# Patient Record
Sex: Female | Born: 1990 | Hispanic: Yes | Marital: Single | Smoking: Never smoker
Health system: Southern US, Community
[De-identification: ages and names within clinical notes are randomized; demographics above are authoritative.]

## PROBLEM LIST (undated history)

## (undated) ENCOUNTER — Inpatient Hospital Stay: Payer: Self-pay

## (undated) VITALS — BP 89/57 | HR 80 | Resp 18

## (undated) DIAGNOSIS — Z789 Other specified health status: Secondary | ICD-10-CM

## (undated) HISTORY — PX: NO PAST SURGERIES: SHX2092

---
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
## 2010-10-28 ENCOUNTER — Inpatient Hospital Stay: Payer: Self-pay

## 2011-07-28 ENCOUNTER — Emergency Department: Payer: Self-pay | Admitting: Emergency Medicine

## 2011-07-28 LAB — CBC
HCT: 36.4 % (ref 35.0–47.0)
MCH: 27.6 pg (ref 26.0–34.0)
MCV: 82 fL (ref 80–100)
RBC: 4.45 10*6/uL (ref 3.80–5.20)
RDW: 13.3 % (ref 11.5–14.5)
WBC: 7.4 10*3/uL (ref 3.6–11.0)

## 2011-07-28 LAB — COMPREHENSIVE METABOLIC PANEL
Albumin: 4.3 g/dL (ref 3.4–5.0)
Alkaline Phosphatase: 109 U/L (ref 50–136)
Bilirubin,Total: 0.6 mg/dL (ref 0.2–1.0)
Calcium, Total: 8.8 mg/dL (ref 8.5–10.1)
Chloride: 108 mmol/L — ABNORMAL HIGH (ref 98–107)
Co2: 21 mmol/L (ref 21–32)
EGFR (Non-African Amer.): >60
Potassium: 3.8 mmol/L (ref 3.5–5.1)
SGPT (ALT): 16 U/L
Sodium: 142 mmol/L (ref 136–145)

## 2012-03-25 ENCOUNTER — Emergency Department: Payer: Self-pay | Admitting: Emergency Medicine

## 2012-03-25 LAB — CBC
HCT: 35.3 % (ref 35.0–47.0)
MCV: 82 fL (ref 80–100)
Platelet: 159 10*3/uL (ref 150–440)
RBC: 4.3 10*6/uL (ref 3.80–5.20)
WBC: 6.6 10*3/uL (ref 3.6–11.0)

## 2012-10-30 ENCOUNTER — Ambulatory Visit: Payer: Self-pay | Admitting: Family Medicine

## 2014-08-16 ENCOUNTER — Emergency Department
Admit: 2014-08-16 | Disposition: A | Discharge status: Home / Self Care | Payer: Self-pay | Admitting: Emergency Medicine

## 2014-08-16 LAB — CBC
HCT: 36.3 % (ref 35.0–47.0)
HGB: 12.4 g/dL (ref 12.0–16.0)
MCH: 29.1 pg (ref 26.0–34.0)
MCHC: 34 g/dL (ref 32.0–36.0)
MCV: 85 fL (ref 80–100)
PLATELETS: 185 10*3/uL (ref 150–440)
RBC: 4.26 10*6/uL (ref 3.80–5.20)
RDW: 13 % (ref 11.5–14.5)
WBC: 7.1 10*3/uL (ref 3.6–11.0)

## 2014-08-16 LAB — BASIC METABOLIC PANEL
Anion Gap: 4 — ABNORMAL LOW (ref 7–16)
BUN: 18 mg/dL
CHLORIDE: 106 mmol/L
CO2: 22 mmol/L
Calcium, Total: 8.6 mg/dL — ABNORMAL LOW
Creatinine: 0.87 mg/dL
EGFR (African American): >60
EGFR (Non-African Amer.): >60
GLUCOSE: 91 mg/dL
Potassium: 5 mmol/L
Sodium: 132 mmol/L — ABNORMAL LOW

## 2014-08-16 LAB — URINALYSIS, COMPLETE
BILIRUBIN, UR: NEGATIVE
GLUCOSE, UR: NEGATIVE mg/dL (ref 0–75)
Ketone: NEGATIVE
Nitrite: NEGATIVE
Ph: 6 (ref 4.5–8.0)
Protein: 100
Specific Gravity: 1.025 (ref 1.003–1.030)
Squamous Epithelial: 3
WBC UR: 49 /HPF (ref 0–5)

## 2014-08-16 LAB — PREGNANCY, URINE: Pregnancy Test, Urine: NEGATIVE m[IU]/mL

## 2014-09-12 ENCOUNTER — Emergency Department
Admit: 2014-09-12 | Discharge: 2014-09-13 | Disposition: A | Discharge status: Home / Self Care | Payer: Medicaid Other | Source: Ambulatory Visit | Attending: Emergency Medicine | Admitting: Emergency Medicine

## 2014-09-12 DIAGNOSIS — Z3202 Encounter for pregnancy test, result negative: Secondary | ICD-10-CM | POA: Diagnosis not present

## 2014-09-12 DIAGNOSIS — R110 Nausea: Secondary | ICD-10-CM | POA: Diagnosis not present

## 2014-09-12 DIAGNOSIS — R6883 Chills (without fever): Secondary | ICD-10-CM | POA: Diagnosis not present

## 2014-09-12 DIAGNOSIS — N760 Acute vaginitis: Secondary | ICD-10-CM | POA: Insufficient documentation

## 2014-09-12 DIAGNOSIS — R42 Dizziness and giddiness: Secondary | ICD-10-CM | POA: Diagnosis not present

## 2014-09-12 DIAGNOSIS — Z975 Presence of (intrauterine) contraceptive device: Secondary | ICD-10-CM

## 2014-09-12 DIAGNOSIS — N83519 Torsion of ovary and ovarian pedicle, unspecified side: Secondary | ICD-10-CM

## 2014-09-12 DIAGNOSIS — R1032 Left lower quadrant pain: Secondary | ICD-10-CM

## 2014-09-12 DIAGNOSIS — R109 Unspecified abdominal pain: Secondary | ICD-10-CM | POA: Diagnosis present

## 2014-09-12 DIAGNOSIS — B9689 Other specified bacterial agents as the cause of diseases classified elsewhere: Secondary | ICD-10-CM

## 2014-09-12 LAB — COMPREHENSIVE METABOLIC PANEL
Albumin: 4.1 g/dL
Alkaline Phosphatase: 68 U/L
Anion Gap: 6 — ABNORMAL LOW (ref 7–16)
BUN: 9 mg/dL
Bilirubin,Total: 0.5 mg/dL
Calcium, Total: 8.7 mg/dL — ABNORMAL LOW
Chloride: 106 mmol/L
Co2: 24 mmol/L
Creatinine: 0.55 mg/dL
EGFR (African American): >60
EGFR (Non-African Amer.): >60
Glucose: 87 mg/dL
Potassium: 3.4 mmol/L — ABNORMAL LOW
SGOT(AST): 20 U/L
SGPT (ALT): 13 U/L — ABNORMAL LOW
Sodium: 136 mmol/L
Total Protein: 7.1 g/dL

## 2014-09-12 LAB — CBC WITH DIFFERENTIAL/PLATELET
BASOS PCT: 1.3 %
Basophil #: 0.1 10*3/uL (ref 0.0–0.1)
Eosinophil #: 0.1 10*3/uL (ref 0.0–0.7)
Eosinophil %: 1.2 %
HCT: 38.6 % (ref 35.0–47.0)
HGB: 12.4 g/dL (ref 12.0–16.0)
LYMPHS ABS: 1.9 10*3/uL (ref 1.0–3.6)
Lymphocyte %: 30.1 %
MCH: 27.6 pg (ref 26.0–34.0)
MCHC: 32.2 g/dL (ref 32.0–36.0)
MCV: 86 fL (ref 80–100)
MONOS PCT: 6.4 %
Monocyte #: 0.4 x10 3/mm (ref 0.2–0.9)
NEUTROS ABS: 3.9 10*3/uL (ref 1.4–6.5)
Neutrophil %: 61 %
Platelet: 187 10*3/uL (ref 150–440)
RBC: 4.51 10*6/uL (ref 3.80–5.20)
RDW: 13 % (ref 11.5–14.5)
WBC: 6.5 10*3/uL (ref 3.6–11.0)

## 2014-09-12 LAB — LIPASE, BLOOD: Lipase: 40 U/L

## 2014-09-13 ENCOUNTER — Encounter: Payer: Self-pay | Admitting: Emergency Medicine

## 2014-09-13 ENCOUNTER — Emergency Department: Payer: Medicaid Other

## 2014-09-13 LAB — CHLAMYDIA/NGC RT PCR (ARMC ONLY)
Chlamydia Tr: NOT DETECTED
N gonorrhoeae: NOT DETECTED

## 2014-09-13 LAB — URINALYSIS COMPLETE WITH MICROSCOPIC (ARMC ONLY)
BACTERIA UA: NONE SEEN
Bilirubin Urine: NEGATIVE
GLUCOSE, UA: NEGATIVE mg/dL
Hgb urine dipstick: NEGATIVE
Ketones, ur: NEGATIVE mg/dL
NITRITE: NEGATIVE
Protein, ur: NEGATIVE mg/dL
Specific Gravity, Urine: 1.006 (ref 1.005–1.030)
pH: 6 (ref 5.0–8.0)

## 2014-09-13 LAB — WET PREP, GENITAL
Trich, Wet Prep: NONE SEEN
Yeast Wet Prep HPF POC: NONE SEEN

## 2014-09-13 LAB — POCT PREGNANCY, URINE: PREG TEST UR: NEGATIVE

## 2014-09-13 MED ORDER — OXYCODONE-ACETAMINOPHEN 5-325 MG PO TABS
1.0000 | ORAL_TABLET | Freq: Once | ORAL | Status: AC
  Administered 2014-09-13: 1 via ORAL

## 2014-09-13 MED ORDER — TRAMADOL HCL 50 MG PO TABS: 50.0000 mg | ORAL_TABLET | Freq: Four times a day (QID) | ORAL | Status: AC | PRN

## 2014-09-13 MED ORDER — METRONIDAZOLE 500 MG PO TABS
ORAL_TABLET | ORAL | Status: AC
  Filled 2014-09-13: qty 1

## 2014-09-13 MED ORDER — METRONIDAZOLE 500 MG PO TABS: 500.0000 mg | ORAL_TABLET | Freq: Two times a day (BID) | ORAL | Status: AC

## 2014-09-13 MED ORDER — METRONIDAZOLE 500 MG PO TABS
500.0000 mg | ORAL_TABLET | Freq: Once | ORAL | Status: AC
  Administered 2014-09-13: 500 mg via ORAL

## 2014-09-13 NOTE — ED Notes (Signed)
Pt ambulatory with steady gait upon discharge.  

## 2014-09-13 NOTE — ED Notes (Signed)
MD at bedside. 

## 2014-09-13 NOTE — ED Provider Notes (Signed)
Patient seen prior to epic migration. Please refer to paper T-sheet for history and physical exam  Rebecka ApleyAllison P Webster, MD 09/13/14 [phone]

## 2014-09-13 NOTE — ED Notes (Signed)
Patient triaged and assessed in sunrise and [facility]

## 2014-09-13 NOTE — Discharge Instructions (Signed)
Abdominal Pain, Women °Abdominal (stomach, pelvic, or belly) pain can be caused by many things. It is important to tell your doctor: °· The location of the pain. °· Does it come and go or is it present all the time? °· Are there things that start the pain (eating certain foods, exercise)? °· Are there other symptoms associated with the pain (fever, nausea, vomiting, diarrhea)? °All of this is helpful to know when trying to find the cause of the pain. °CAUSES  °· Stomach: virus or bacteria infection, or ulcer. °· Intestine: appendicitis (inflamed appendix), regional ileitis (Crohn's disease), ulcerative colitis (inflamed colon), irritable bowel syndrome, diverticulitis (inflamed diverticulum of the colon), or cancer of the stomach or intestine. °· Gallbladder disease or stones in the gallbladder. °· Kidney disease, kidney stones, or infection. °· Pancreas infection or cancer. °· Fibromyalgia (pain disorder). °· Diseases of the female organs: °· Uterus: fibroid (non-cancerous) tumors or infection. °· Fallopian tubes: infection or tubal pregnancy. °· Ovary: cysts or tumors. °· Pelvic adhesions (scar tissue). °· Endometriosis (uterus lining tissue growing in the pelvis and on the pelvic organs). °· Pelvic congestion syndrome (female organs filling up with blood just before the menstrual period). °· Pain with the menstrual period. °· Pain with ovulation (producing an egg). °· Pain with an IUD (intrauterine device, birth control) in the uterus. °· Cancer of the female organs. °· Functional pain (pain not caused by a disease, may improve without treatment). °· Psychological pain. °· Depression. °DIAGNOSIS  °Your doctor will decide the seriousness of your pain by doing an examination. °· Blood tests. °· X-rays. °· Ultrasound. °· CT scan (computed tomography, special type of X-ray). °· MRI (magnetic resonance imaging). °· Cultures, for infection. °· Barium enema (dye inserted in the large intestine, to better view it with  X-rays). °· Colonoscopy (looking in intestine with a lighted tube). °· Laparoscopy (minor surgery, looking in abdomen with a lighted tube). °· Major abdominal exploratory surgery (looking in abdomen with a large incision). °TREATMENT  °The treatment will depend on the cause of the pain.  °· Many cases can be observed and treated at home. °· Over-the-counter medicines recommended by your caregiver. °· Prescription medicine. °· Antibiotics, for infection. °· Birth control pills, for painful periods or for ovulation pain. °· Hormone treatment, for endometriosis. °· Nerve blocking injections. °· Physical therapy. °· Antidepressants. °· Counseling with a psychologist or psychiatrist. °· Minor or major surgery. °HOME CARE INSTRUCTIONS  °· Do not take laxatives, unless directed by your caregiver. °· Take over-the-counter pain medicine only if ordered by your caregiver. Do not take aspirin because it can cause an upset stomach or bleeding. °· Try a clear liquid diet (broth or water) as ordered by your caregiver. Slowly move to a bland diet, as tolerated, if the pain is related to the stomach or intestine. °· Have a thermometer and take your temperature several times a day, and record it. °· Bed rest and sleep, if it helps the pain. °· Avoid sexual intercourse, if it causes pain. °· Avoid stressful situations. °· Keep your follow-up appointments and tests, as your caregiver orders. °· If the pain does not go away with medicine or surgery, you may try: °· Acupuncture. °· Relaxation exercises (yoga, meditation). °· Group therapy. °· Counseling. °SEEK MEDICAL CARE IF:  °· You notice certain foods cause stomach pain. °· Your home care treatment is not helping your pain. °· You need stronger pain medicine. °· You want your IUD removed. °· You feel faint or   lightheaded. °· You develop nausea and vomiting. °· You develop a rash. °· You are having side effects or an allergy to your medicine. °SEEK IMMEDIATE MEDICAL CARE IF:  °· Your  pain does not go away or gets worse. °· You have a fever. °· Your pain is felt only in portions of the abdomen. The right side could possibly be appendicitis. The left lower portion of the abdomen could be colitis or diverticulitis. °· You are passing blood in your stools (bright red or black tarry stools, with or without vomiting). °· You have blood in your urine. °· You develop chills, with or without a fever. °· You pass out. °MAKE SURE YOU:  °· Understand these instructions. °· Will watch your condition. °· Will get help right away if you are not doing well or get worse. °Document Released: 02/26/2007 Document Revised: 09/15/2013 Document Reviewed: 03/18/2009 °ExitCare® Patient Information ©2015 [employer]. This information is not intended to replace advice given to you by your health care provider. Make sure you discuss any questions you have with your health care provider. °Bacterial Vaginosis °Bacterial vaginosis is a vaginal infection that occurs when the normal balance of bacteria in the vagina is disrupted. It results from an overgrowth of certain bacteria. This is the most common vaginal infection in women of childbearing age. Treatment is important to prevent complications, especially in pregnant women, as it can cause a premature delivery. °CAUSES  °Bacterial vaginosis is caused by an increase in harmful bacteria that are normally present in smaller amounts in the vagina. Several different kinds of bacteria can cause bacterial vaginosis. However, the reason that the condition develops is not fully understood. °RISK FACTORS °Certain activities or behaviors can put you at an increased risk of developing bacterial vaginosis, including: °· Having a new sex partner or multiple sex partners. °· Douching. °· Using an intrauterine device (IUD) for contraception. °Women do not get bacterial vaginosis from toilet seats, bedding, swimming pools, or contact with objects around them. °SIGNS AND SYMPTOMS  °Some  women with bacterial vaginosis have no signs or symptoms. Common symptoms include: °· Grey vaginal discharge. °· A fishlike odor with discharge, especially after sexual intercourse. °· Itching or burning of the vagina and vulva. °· Burning or pain with urination. °DIAGNOSIS  °Your health care provider will take a medical history and examine the vagina for signs of bacterial vaginosis. A sample of vaginal fluid may be taken. Your health care provider will look at this sample under a microscope to check for bacteria and abnormal cells. A vaginal pH test may also be done.  °TREATMENT  °Bacterial vaginosis may be treated with antibiotic medicines. These may be given in the form of a pill or a vaginal cream. A second round of antibiotics may be prescribed if the condition comes back after treatment.  °HOME CARE INSTRUCTIONS  °· Only take over-the-counter or prescription medicines as directed by your health care provider. °· If antibiotic medicine was prescribed, take it as directed. Make sure you finish it even if you start to feel better. °· Do not have sex until treatment is completed. °· Tell all sexual partners that you have a vaginal infection. They should see their health care provider and be treated if they have problems, such as a mild rash or itching. °· Practice safe sex by using condoms and only having one sex partner. °SEEK MEDICAL CARE IF:  °· Your symptoms are not improving after 3 days of treatment. °· You have increased discharge   or pain. °· You have a fever. °MAKE SURE YOU:  °· Understand these instructions. °· Will watch your condition. °· Will get help right away if you are not doing well or get worse. °FOR MORE INFORMATION  °Centers for Disease Control and Prevention, Division of STD Prevention: www.cdc.gov/std °American Sexual Health Association (ASHA): www.ashastd.org  °Document Released: 05/01/2005 Document Revised: 02/19/2013 Document Reviewed: 12/11/2012 °ExitCare® Patient Information ©2015  [employer]. This information is not intended to replace advice given to you by your health care provider. Make sure you discuss any questions you have with your health care provider. ° °

## 2014-09-13 NOTE — ED Notes (Signed)
Pt returned from US; understands nothing to eat until US resulted and MD can followup; pt rates pain 6/10

## 2015-03-09 ENCOUNTER — Other Ambulatory Visit: Payer: Self-pay | Admitting: Advanced Practice Midwife

## 2015-03-09 DIAGNOSIS — Z8279 Family history of other congenital malformations, deformations and chromosomal abnormalities: Secondary | ICD-10-CM

## 2015-03-22 ENCOUNTER — Ambulatory Visit
Admission: RE | Admit: 2015-03-22 | Discharge: 2015-03-22 | Disposition: A | Discharge status: Home / Self Care | Payer: Medicaid Other | Source: Ambulatory Visit | Attending: Advanced Practice Midwife | Admitting: Advanced Practice Midwife

## 2015-03-22 ENCOUNTER — Ambulatory Visit (HOSPITAL_BASED_OUTPATIENT_CLINIC_OR_DEPARTMENT_OTHER)
Admission: RE | Admit: 2015-03-22 | Discharge: 2015-03-22 | Disposition: A | Discharge status: Home / Self Care | Payer: Medicaid Other | Source: Ambulatory Visit | Attending: Maternal & Fetal Medicine | Admitting: Maternal & Fetal Medicine

## 2015-03-22 VITALS — BP 104/56 | HR 98 | Temp 99.6°F | Ht 61.5 in | Wt 183.0 lb

## 2015-03-22 DIAGNOSIS — Z8279 Family history of other congenital malformations, deformations and chromosomal abnormalities: Secondary | ICD-10-CM | POA: Insufficient documentation

## 2015-03-22 DIAGNOSIS — Z8481 Family history of carrier of genetic disease: Secondary | ICD-10-CM | POA: Insufficient documentation

## 2015-03-22 DIAGNOSIS — O26891 Other specified pregnancy related conditions, first trimester: Secondary | ICD-10-CM | POA: Diagnosis present

## 2015-03-22 DIAGNOSIS — Z3A12 12 weeks gestation of pregnancy: Secondary | ICD-10-CM | POA: Diagnosis not present

## 2015-03-22 HISTORY — DX: Other specified health status: Z78.9

## 2015-03-22 NOTE — Progress Notes (Addendum)
Referring physician:  [facility] Department Length of Consultation: 40 minutes   Ms. Christine Clark  was referred to [facility] for genetic counseling to review prenatal screening and testing options.  This note summarizes the information we discussed.  The patient was counseled by Jackey Loge, genetic counseling intern, supervised by Katrina Stack, MS, CGC.  First trimester screening, which can include nuchal translucency ultrasound screen and/or first trimester maternal serum marker screening.  The nuchal translucency has approximately an 80% detection rate for Down syndrome and can be positive for other chromosome abnormalities as well as congenital heart defects.  When combined with a maternal serum marker screening, the detection rate is up to 90% for Down syndrome and up to 97% for trisomy 18.     Maternal serum marker screening, a blood test that measures pregnancy proteins, can provide risk assessments for Down syndrome, trisomy 18, and open neural tube defects (spina bifida, anencephaly). Because it does not directly examine the fetus, it cannot positively diagnose or rule out these problems.  Targeted ultrasound uses high frequency sound waves to create an image of the developing fetus.  An ultrasound is often recommended as a routine means of evaluating the pregnancy.  It is also used to screen for fetal anatomy problems (for example, a heart defect) that might be suggestive of a chromosomal or other abnormality.   Should these screening tests indicate an increased concern, then the following diagnostic options would be offered:  The chorionic villus sampling procedure is available for first trimester chromosome analysis.  This involves the withdrawal of a small amount of chorionic villi (tissue from the developing placenta).  Risk of pregnancy loss is estimated to be approximately 1 in 200 to 1 in 100 (0.5 to 1%).  There is approximately a 1% (1 in 100)  chance that the CVS chromosome results will be unclear.  Chorionic villi cannot be tested for neural tube defects.     Amniocentesis involves the removal of a small amount of amniotic fluid from the sac surrounding the fetus with the use of a thin needle inserted through the maternal abdomen and uterus.  Ultrasound guidance is used throughout the procedure.  Fetal cells from amniotic fluid are directly evaluated and > 99.5% of chromosome problems and > 98% of open neural tube defects can be detected. This procedure is generally performed after the 15th week of pregnancy.  The main risks to this procedure include complications leading to miscarriage in less than 1 in 200 cases (0.5%).  As another option for information if the pregnancy is suspected to be an an increased chance for certain chromosome conditions, we also reviewed the availability of cell free fetal DNA testing from maternal blood to determine whether or not the baby may have either Down syndrome, trisomy 53, or trisomy 42.  This test utilizes a maternal blood sample and DNA sequencing technology to isolate circulating cell free fetal DNA from maternal plasma.  The fetal DNA can then be analyzed for DNA sequences that are derived from the three most common chromosomes involved in aneuploidy, chromosomes 13, 18, and 21.  If the overall amount of DNA is greater than the expected level for any of these chromosomes, aneuploidy is suspected.  While we do not consider it a replacement for invasive testing and karyotype analysis, a negative result from this testing would be reassuring, though not a guarantee of a normal chromosome complement for the baby.  An abnormal result is certainly suggestive  of an abnormal chromosome complement, though we would still recommend CVS or amniocentesis to confirm any findings from this testing.   Cystic Fibrosis screening was also discussed with the patient. Cystic fibrosis (CF) is one of the most common genetic  conditions in persons of Caucasian ancestry.  This condition occurs in approximately 1 in 2,500 Caucasian persons and results in thickened secretions in the lungs, digestive, and reproductive systems.  For a baby to be at risk for having CF, both of the parents must be carriers for this condition.  Approximately 1 in 3525 Caucasian persons is a carrier for CF.  Current carrier testing looks for the most common mutations in the gene for CF and can detect approximately 90% of carriers in the Caucasian population.  This means that the carrier screening can greatly reduce, but cannot eliminate, the chance for an individual to have a child with CF.  If an individual is found to be a carrier for CF, then carrier testing would be available for the partner. As part of [place] newborn screening profile, all babies born in the state of [place] will have a two-tier screening process.  Specimens are first tested to determine the concentration of immunoreactive trypsinogen (IRT).  The top 5% of specimens with the highest IRT values then undergo DNA testing using a panel of over 40 common CF mutations.   We obtained a detailed family history and pregnancy history.  Mrs. Christine RaringBahenaDelgado has two children (ages 47 and 439) with a different partner who each require an IEP for reading difficulties. Her 25-year old daughter with the same partner as this pregnancy attends [employer] due to some speech delays. Her 25-year old identical twin sisters were also noted as having some trouble reading in school. Learning difficulties can have a genetic component as well as environmental or other factors. No genetic evaluation of her other three children has been performed per the patient, but evaluation for learning disabilities by a medical geneticist would be available if she chose to pursue further information. The father of baby is reported to have a niece with Down syndrome. This child is reported to be in a wheelchair and requires  assistance feeding which are not features typical in Down syndrome. We discussed that although it is not likely the father of the baby's niece has Down syndrome, the first trimester screening as well as a second trimester anatomy scan can help screen for Down syndrome in this pregnancy. The best way to assess the level of concern for a similar condition in other family members would be to review medical records on this niece to determine the specific diagnosis.  If the family would like to obtain medical records, we are happy to discuss this further.  The remainder of the family history was reported to be unremarkable for birth defects, developmental differences, recurrent pregnancy loss or known chromosome abnormalities.  Ms. Christine RaringBahenadelgado stated that this is her fourth pregnancy.  She reported no complications or exposures that would be expected to increase the risk for birth defects.  After consideration of the options, Ms. Longest elected to proceed with first trimester screening.  An ultrasound was performed at the time of the visit.  The gestational age was consistent with  12 weeks.  Fetal anatomy could not be assessed due to early gestational age.  Please refer to the ultrasound report for details of that study.  Ms. Christine RaringBahenadelgado was encouraged to call with questions or concerns.  We can be contacted at [phone].   Cherly Anderson, MS, CGC  I was immediately available and supervising. Argentina Ponder, MD [facility]

## 2015-03-25 ENCOUNTER — Telehealth: Payer: Self-pay | Admitting: Obstetrics and Gynecology

## 2015-03-25 NOTE — Telephone Encounter (Signed)
  Christine Clark elected to undergo First Trimester screening on March 22, 2015.  To review, first trimester screening, includes nuchal translucency ultrasound screen and/or first trimester maternal serum marker screening.  The nuchal translucency has approximately an 80% detection rate for Down syndrome and can be positive for other chromosome abnormalities as well as heart defects.  When combined with a maternal serum marker screening, the detection rate is up to 90% for Down syndrome and up to 97% for trisomy 13 and 18.     The results of the First Trimester Nuchal Translucency and Biochemical Screening were within normal range.  The risk for Down syndrome is now estimated to be less than 1 in 10,000.  The risk for Trisomy 13/18 is also estimated to be less than 1 in 10,000.  Should more definitive information be desired, we would offer amniocentesis.  Because we do not yet know the effectiveness of combined first and second trimester screening, we do not recommend a maternal serum screen to assess the chance for chromosome conditions.  However, if screening for neural tube defects is desired, maternal serum screening for AFP only can be performed between 15 and [redacted] weeks gestation.    Cherly Andersoneborah F. Tahjae Clausing, MS, CGC

## 2015-04-26 NOTE — Addendum Note (Signed)
Encounter addended by: Lady DeutscherAndra Sartaj Hoskin, MD on: 04/26/2015  9:01 AM<BR>     Documentation filed: Charges VN, Notes Section

## 2015-05-03 ENCOUNTER — Other Ambulatory Visit: Payer: Self-pay

## 2015-05-03 ENCOUNTER — Ambulatory Visit
Admission: RE | Admit: 2015-05-03 | Discharge: 2015-05-03 | Disposition: A | Discharge status: Home / Self Care | Payer: Medicaid Other | Source: Ambulatory Visit | Attending: Maternal & Fetal Medicine | Admitting: Maternal & Fetal Medicine

## 2015-05-03 VITALS — BP 115/54 | HR 72 | Temp 97.9°F | Wt 183.0 lb

## 2015-05-03 DIAGNOSIS — Z36 Encounter for antenatal screening of mother: Secondary | ICD-10-CM | POA: Diagnosis present

## 2015-05-03 DIAGNOSIS — O99212 Obesity complicating pregnancy, second trimester: Secondary | ICD-10-CM | POA: Diagnosis present

## 2015-05-03 DIAGNOSIS — E669 Obesity, unspecified: Secondary | ICD-10-CM | POA: Insufficient documentation

## 2015-05-03 DIAGNOSIS — Z8279 Family history of other congenital malformations, deformations and chromosomal abnormalities: Secondary | ICD-10-CM | POA: Diagnosis present

## 2015-05-03 DIAGNOSIS — Z3A18 18 weeks gestation of pregnancy: Secondary | ICD-10-CM | POA: Insufficient documentation

## 2015-05-16 NOTE — L&D Delivery Note (Signed)
VAGINAL DELIVERY NOTE:  Date of Delivery: 09/30/2015 Primary OB:  Gestational Age/EDD: 2056w6d 10/01/2015, Alternate EDD Entry Antepartum complications: None Attending Physician: Milon Scorearon Randye Treichler, CNM Delivery Type: NSVD after 1 attempt to place vacuum with a pull where the vacuum popped off x 1 Anesthesia: Local 1% for repair Laceration: 2nd degree repaired with 3-0 CH on CT.  Episiotomy: none Placenta: spontaneous Intrapartum complications: Early decels to 60's x 1-2 mins with recover with O2 and Lt side pos Estimated Blood Loss:200 mls GBS:neg Procedure Details: At 0945, pt complete and pushing early decels to the 60's x 1 min, O2 in use and FHR returned to baseline on Lt side. Pt enc to push and vtx was at a +3 station. After decels were q min to the 60's for 5 mins, a decision to place the vacuum was made. Risks including intracerebral bleed, bleeding, injury to scalp were discussed vs allowing baby to be hypoxic. The pt consented and a Kiwi vacuum was placed on the post skull in the OA position. The chignon was created due to holding the suction in the green zone for 2 mins prior to pulling x 1 when the suction could not be held to maintain the vacuum. The pt made progress quickly and within a few pushes, the pt had a spont vag delivery of a viable female infant. Cord ph sent. Baby cried initially and was allowed skin to skin with pt. CCx2 and cut per CNM. FOB in a chair and did not want to interact due to fainting fears. The cord was drained for cord blood. The placenta del in a Tomasa BlaseSchultz fashion and there was a 2nd degree lac repaired with 3-0 CH on CT after 15 mls of 1% lidocaine was inserted. The area was inspected and repair completed. FF and lochia mod. EBL 200 mls' Pt holding baby and happy with her birth.   Baby: Liveborn female, Apgars, weight #,  oz, baby named "

## 2015-07-13 ENCOUNTER — Observation Stay
Admission: EM | Admit: 2015-07-13 | Discharge: 2015-07-14 | Disposition: A | Discharge status: Home / Self Care | Payer: Medicaid Other | Attending: Obstetrics and Gynecology | Admitting: Obstetrics and Gynecology

## 2015-07-13 DIAGNOSIS — O4700 False labor before 37 completed weeks of gestation, unspecified trimester: Secondary | ICD-10-CM | POA: Diagnosis present

## 2015-07-13 DIAGNOSIS — Z3A28 28 weeks gestation of pregnancy: Secondary | ICD-10-CM | POA: Insufficient documentation

## 2015-07-13 DIAGNOSIS — O479 False labor, unspecified: Secondary | ICD-10-CM | POA: Diagnosis present

## 2015-07-13 LAB — WET PREP, GENITAL
Sperm: NONE SEEN
TRICH WET PREP: NONE SEEN
Yeast Wet Prep HPF POC: NONE SEEN

## 2015-07-13 LAB — COMPREHENSIVE METABOLIC PANEL
ALT: 10 U/L — AB (ref 14–54)
AST: 18 U/L (ref 15–41)
Albumin: 2.8 g/dL — ABNORMAL LOW (ref 3.5–5.0)
Alkaline Phosphatase: 70 U/L (ref 38–126)
Anion gap: 7 (ref 5–15)
BILIRUBIN TOTAL: 0.2 mg/dL — AB (ref 0.3–1.2)
BUN: 8 mg/dL (ref 6–20)
CALCIUM: 8.4 mg/dL — AB (ref 8.9–10.3)
CO2: 21 mmol/L — ABNORMAL LOW (ref 22–32)
CREATININE: 0.41 mg/dL — AB (ref 0.44–1.00)
Chloride: 110 mmol/L (ref 101–111)
GFR calc Af Amer: >60 mL/min (ref >60)
Glucose, Bld: 84 mg/dL (ref 65–99)
POTASSIUM: 3.7 mmol/L (ref 3.5–5.1)
Sodium: 138 mmol/L (ref 135–145)
TOTAL PROTEIN: 6.1 g/dL — AB (ref 6.5–8.1)

## 2015-07-13 LAB — URINALYSIS COMPLETE WITH MICROSCOPIC (ARMC ONLY)
BILIRUBIN URINE: NEGATIVE
Bacteria, UA: NONE SEEN
Glucose, UA: NEGATIVE mg/dL
Hgb urine dipstick: NEGATIVE
KETONES UR: NEGATIVE mg/dL
Nitrite: NEGATIVE
PH: 6 (ref 5.0–8.0)
Protein, ur: NEGATIVE mg/dL
Specific Gravity, Urine: 1.026 (ref 1.005–1.030)

## 2015-07-13 LAB — CBC
HCT: 31.8 % — ABNORMAL LOW (ref 35.0–47.0)
Hemoglobin: 10.7 g/dL — ABNORMAL LOW (ref 12.0–16.0)
MCH: 27.9 pg (ref 26.0–34.0)
MCHC: 33.7 g/dL (ref 32.0–36.0)
MCV: 82.6 fL (ref 80.0–100.0)
PLATELETS: 206 10*3/uL (ref 150–440)
RBC: 3.85 MIL/uL (ref 3.80–5.20)
RDW: 12.6 % (ref 11.5–14.5)
WBC: 7.4 10*3/uL (ref 3.6–11.0)

## 2015-07-13 LAB — URINE DRUG SCREEN, QUALITATIVE (ARMC ONLY)
AMPHETAMINES, UR SCREEN: NOT DETECTED
BENZODIAZEPINE, UR SCRN: NOT DETECTED
Barbiturates, Ur Screen: NOT DETECTED
Cannabinoid 50 Ng, Ur ~~LOC~~: NOT DETECTED
Cocaine Metabolite,Ur ~~LOC~~: NOT DETECTED
MDMA (Ecstasy)Ur Screen: NOT DETECTED
METHADONE SCREEN, URINE: NOT DETECTED
Opiate, Ur Screen: NOT DETECTED
PHENCYCLIDINE (PCP) UR S: NOT DETECTED
TRICYCLIC, UR SCREEN: NOT DETECTED

## 2015-07-13 LAB — RAPID HIV SCREEN (HIV 1/2 AB+AG)
HIV 1/2 ANTIBODIES: NONREACTIVE
HIV-1 P24 Antigen - HIV24: NONREACTIVE

## 2015-07-13 MED ORDER — LACTATED RINGERS IV SOLN
500.0000 mL | INTRAVENOUS | Status: DC | PRN
Start: 2015-07-13 — End: 2015-07-14

## 2015-07-13 MED ORDER — INDOMETHACIN 50 MG PO CAPS
50.0000 mg | ORAL_CAPSULE | Freq: Once | ORAL | Status: AC
  Administered 2015-07-13: 50 mg via ORAL
  Filled 2015-07-13: qty 1

## 2015-07-13 MED ORDER — INDOMETHACIN 25 MG PO CAPS
25.0000 mg | ORAL_CAPSULE | Freq: Four times a day (QID) | ORAL | Status: DC
  Administered 2015-07-14: 25 mg via ORAL
  Filled 2015-07-13 (×5): qty 1

## 2015-07-13 MED ORDER — BETAMETHASONE SOD PHOS & ACET 6 (3-3) MG/ML IJ SUSP
12.0000 mg | INTRAMUSCULAR | Status: DC
  Administered 2015-07-13: 12 mg via INTRAMUSCULAR

## 2015-07-13 MED ORDER — LACTATED RINGERS IV SOLN
INTRAVENOUS | Status: DC
  Administered 2015-07-13 – 2015-07-14 (×2): via INTRAVENOUS

## 2015-07-13 MED ORDER — BETAMETHASONE SOD PHOS & ACET 6 (3-3) MG/ML IJ SUSP
INTRAMUSCULAR | Status: AC
  Administered 2015-07-13: 12 mg via INTRAMUSCULAR
  Filled 2015-07-13: qty 1

## 2015-07-13 NOTE — OB Triage Provider Note (Signed)
TRIAGE NOTE to rule out Preterm Labor   History of Present Illness: Christine Clark is a 25 y.o. [phone] at [redacted]w[redacted]d presenting to triage for rule out preterm labor. Pt reports sharp pain that took her home early from work today, with cramping and tightening. +white vaginal discharge, dizziness. No dysuria. Normal fetal movement. No fever. Denies drug use.  3 prior NSVD at term. No recent sick contacts.  Patient Active Problem List   Diagnosis Date Noted  . Preterm contractions 07/13/2015  . Family history of congenital anomalies 03/22/2015    Past Medical History  Diagnosis Date  . Medical history non-contributory     Past Surgical History  Procedure Laterality Date  . No past surgeries      OB History  Gravida Para Term Preterm AB SAB TAB Ectopic Multiple Living  # Outcome Date GA Lbr Len/2nd Weight Sex Delivery Anes PTL Lv  4 Current           3 Term 10/28/10    F Vag-Spont   Y  2 Term 10/17/07    F Vag-Spont   Y  1 Term 12/11/05    Heide Scales      Social History   Social History  . Marital Status: Single    Spouse Name: N/A  . Number of Children: N/A  . Years of Education: N/A   Social History Main Topics  . Smoking status: Never Smoker   . Smokeless tobacco: Never Used  . Alcohol Use: No  . Drug Use: No  . Sexual Activity: Yes   Other Topics Concern  . None   Social History Narrative    History reviewed. No pertinent family history.  No Known Allergies  Prescriptions prior to admission  Medication Sig Dispense Refill Last Dose  . Prenatal Vit-Fe Fumarate-FA (PRENATAL MULTIVITAMIN) TABS tablet Take 1 tablet by mouth daily at 12 noon.   Taking  . traMADol (ULTRAM) 50 MG tablet Take 1 tablet (50 mg total) by mouth every 6 (six) hours as needed. (Patient not taking: Reported on 03/22/2015) 12 tablet 0 Not Taking    Review of Systems - see hpi  Vitals:  BP 111/59 mmHg  Pulse 75  Temp(Src) 98.7 F (37.1 C) (Oral)  Resp 18   Ht 5' (1.524 m)  Wt 83.915 kg (185 lb)  BMI 36.13 kg/m2  LMP 12/25/2014 Physical Examination: CONSTITUTIONAL: Well-developed, well-nourished female in no acute distress.  HENT:  Normocephalic, atraumatic, SKIN: Skin is warm and dry. No rash noted. Not diaphoretic. No erythema. No pallor. NEUROLGIC: Alert and oriented to person, place, and time. No gross cranial nerve deficit noted. PSYCHIATRIC: Normal mood and affect. Normal behavior. Normal judgment and thought content. CARDIOVASCULAR: Normal heart rate noted, regular rhythm RESPIRATORY: Effort and breath sounds normal, no problems with respiration noted ABDOMEN: Soft, nontender, nondistended, gravid.  Cervix: closed/thick and high Membranes: Intact Fetal Monitoring: Cat I strip with mod var, +accels, no decels, appropriate for gestational age Tocometer: contractions q3-5 min  Labs:  No results found for this or any previous visit (from the past 24 hour(s)).  Imaging Studies: No results found.   Assessment and Plan: Patient Active Problem List   Diagnosis Date Noted  . Preterm contractions 07/13/2015  . Family history of congenital anomalies 03/22/2015   Fetal monitoring - reassuring Betamethasone x 2 doses Indomethicin for tocolysis given regular contraction pattern prior to 32 wks. IVF bolus  Headache/dizziness: Normal BP today. CBC and CMP pending. Fetal fibronectin pending Infection workup: GC/CT, wet mount, UA, CBC sent Will recheck presentation if she does progress in preterm labor to determine route of delivery Routine antenatal care  Cline Cools, MD, MPH

## 2015-07-13 NOTE — Progress Notes (Signed)
Dr Dalbert Garnet called and report given on pt complaint and UC pattern. Order received to perform FFN and SVE, and GBS and call MD with SVE

## 2015-07-14 ENCOUNTER — Inpatient Hospital Stay
Admission: EM | Admit: 2015-07-14 | Discharge: 2015-07-14 | Disposition: A | Discharge status: Home / Self Care | Payer: Medicaid Other | Attending: Obstetrics and Gynecology | Admitting: Obstetrics and Gynecology

## 2015-07-14 DIAGNOSIS — Z3491 Encounter for supervision of normal pregnancy, unspecified, first trimester: Secondary | ICD-10-CM | POA: Insufficient documentation

## 2015-07-14 LAB — CHLAMYDIA/NGC RT PCR (ARMC ONLY)
CHLAMYDIA TR: NOT DETECTED
N GONORRHOEAE: NOT DETECTED

## 2015-07-14 LAB — FETAL FIBRONECTIN: Fetal Fibronectin: NEGATIVE

## 2015-07-14 MED ORDER — INDOMETHACIN 25 MG PO CAPS: 25.0000 mg | ORAL_CAPSULE | Freq: Four times a day (QID) | ORAL | Status: DC

## 2015-07-14 MED ORDER — METRONIDAZOLE 500 MG PO TABS: 500.0000 mg | ORAL_TABLET | Freq: Two times a day (BID) | ORAL | Status: DC

## 2015-07-14 MED ORDER — BETAMETHASONE SOD PHOS & ACET 6 (3-3) MG/ML IJ SUSP
12.0000 mg | Freq: Once | INTRAMUSCULAR | Status: AC
  Administered 2015-07-14: 12 mg via INTRAMUSCULAR
  Filled 2015-07-14: qty 2

## 2015-07-14 MED ORDER — TERBUTALINE SULFATE 1 MG/ML IJ SOLN
INTRAMUSCULAR | Status: AC
  Administered 2015-07-14: 0.25 mg via SUBCUTANEOUS
  Filled 2015-07-14: qty 1

## 2015-07-14 MED ORDER — TERBUTALINE SULFATE 1 MG/ML IJ SOLN
0.2500 mg | Freq: Once | INTRAMUSCULAR | Status: AC
  Administered 2015-07-14: 0.25 mg via SUBCUTANEOUS

## 2015-07-14 MED ORDER — METRONIDAZOLE 500 MG PO TABS
500.0000 mg | ORAL_TABLET | Freq: Two times a day (BID) | ORAL | Status: DC
  Administered 2015-07-14: 500 mg via ORAL
  Filled 2015-07-14: qty 1

## 2015-07-14 MED ORDER — ACETAMINOPHEN-CODEINE #3 300-30 MG PO TABS
2.0000 | ORAL_TABLET | Freq: Four times a day (QID) | ORAL | Status: DC | PRN
  Administered 2015-07-14: 2 via ORAL
  Filled 2015-07-14: qty 2

## 2015-07-14 MED ORDER — OXYCODONE-ACETAMINOPHEN 5-325 MG PO TABS: 1.0000 | ORAL_TABLET | ORAL | Status: DC | PRN

## 2015-07-14 NOTE — Discharge Instructions (Signed)
Preterm Birth Preterm birth is a birth that happens before 37 weeks of pregnancy. Most pregnancies last about 39-41 weeks. Every week in the womb is important and is beneficial to the health of the infant. Infants born before 37 weeks of pregnancy are at a higher risk for complications. Depending on when the infant was born, he or she may be:  Late preterm. Born between 32 weeks and 37 weeks of pregnancy.  Very preterm. Born at less than 32 weeks of pregnancy.  Extremely preterm. Born at less than 25 weeks of pregnancy. The earlier a baby is born, the more likely the child will have issues related to prematurity. Complications and problems that can be seen in infants born too early include:  Problems breathing (respiratory distress syndrome).  Low birth weight.  Problems feeding.  Sleeping problems.  Yellowing of the skin (jaundice).  Infections such as pneumonia. Babies born very preterm or extremely preterm are at risk for more serious medical issues. These include:  More severe breathing issues.  Eyesight issues.  Brain development issues (intraventricular hemorrhage).  Behavioral and emotional development issues.  Growth and developmental delays.  Cerebral palsy.  Serious feeding or bowel complications (necrotizing enterocolitis). CAUSES  There are two broad categories of preterm birth.  Spontaneous preterm birth. This is a birth resulting from preterm labor (not medically induced) or preterm premature rupture of membranes (PPROM).  Indicated preterm birth. This is a birth resulting from labor being medically induced due to health, personal, or social reasons. RISK FACTORS Preterm birth may be related to certain medical conditions, lifestyle factors, or demographic factors encountered by the mother or fetus.  Medical conditions include:  Multiple gestations (twins, triplets, and so on).  Infection.  Diabetes.  Heart disease.  Kidney disease.  Cervical or  uterine abnormalities.  Being underweight.  High blood pressure or preeclampsia.  Premature rupture of membranes (PROM).  Birth defects in the fetus.  Lifestyle factors include:  Poor prenatal care.  Poor nutrition or anemia.  Cigarette smoking.  Consuming alcohol.  High levels of stress and lack of social or emotional support.  Exposure to chemical or environmental toxins.  Substance abuse.  Demographic factors include:  African-American ethnicity.  Age (younger than 18 or older than 26 years of age).  Low socioeconomic status. Women with a history of preterm labor or who become pregnant within 18 months of giving birth are also at increased risk for preterm birth. DIAGNOSIS  Your health care provider may request additional tests to diagnose underlying complications resulting from preterm birth. Tests on the infant may include:  Physical exam.  Blood tests.  Chest X-rays.  Heart-lung monitoring. TREATMENT  After birth, special care will be taken to assess any problems or complications for the infant. Supportive care will be provided for the infant. Treatment depends on what problems are present and any complications that develop. Some preterm infants are cared for in a neonatal intensive care unit. In general, care may include:  Maintaining temperature and oxygen in a clear heated box (baby isolette).  Monitoring the infant's heart rate, breathing, and level of oxygen in the blood.  Monitoring for signs of infection and, if needed, giving IV antibiotic medicine.  Inserting a feeding tube (nose, mouth) or giving IV nutrition if unable to feed.  Inserting a breathing tube (ventilation).  Respiration support (continuous positive airway pressure [CPAP] or oxygen). Treatment will change as the infant builds up strength and is able to breathe and eat on his or her   own. For some infants, no special treatment is necessary. Parents may be educated on the potential  health risks of prematurity to the infant. HOME CARE INSTRUCTIONS  Understand your infant's special conditions and needs. It may be reassuring to learn about infant CPR.  Monitor your infant in the car seat until he or she grows and matures. Infant car seats can cause breathing difficulties for preterm infants.  Keep your infant warm. Dress your infant in layers and keep him or her away from drafts, especially in cold months of the year.  Wash your hands thoroughly after going to the bathroom or changing a diaper. Late preterm infants may be more prone to infection.  Follow all your health care provider's instructions for providing support and care to your preterm infant.  Get support from organizations and groups that understand your challenges.  Follow up with your infant's health care provider as directed. Prevention There are some things you can do to help lower your risk of having a preterm infant in the future. These include:  Good prenatal care throughout the entire pregnancy. See a health care provider regularly for advice and tests.  Management of underlying medical conditions.  Proper self-care and lifestyle changes.  Proper diet and weight control.  Watching for signs of various infections. SEEK MEDICAL CARE IF:  Your infant has feeding difficulties.  Your infant has sleeping difficulties.  Your infant has breathing difficulties.  Your infant's skin starts to look yellow.  Your infant shows signs of infection, such as a stuffy nose, fever, crying, or bluish color of the skin. FOR MORE INFORMATION March of Dimes: www.marchofdimes.com Prematurity.org: www.prematurity.org   This information is not intended to replace advice given to you by your health care provider. Make sure you discuss any questions you have with your health care provider.   Document Released: 07/22/2003 Document Revised: 02/19/2013 Document Reviewed: 11/28/2012 Elsevier Interactive Patient  Education 2016 [employer]vier Inc.  

## 2015-07-14 NOTE — Progress Notes (Signed)
Dr Dalbert Garnet called informed of negative FFN, wet prep results, UC pattern and no cervical change. Order received to continue to monitor, start flagyl po and continue indomethacin.

## 2015-07-14 NOTE — Progress Notes (Signed)
Discharge instructions given and explained.  Verbalized understanding.  Signed copy of DC instructions on chart and one in hand.  Discharged ambulatory to home with prescriptions in hand.  To return this evening at 2100 for repeat betamethasone injection.

## 2015-07-14 NOTE — Discharge Summary (Signed)
  28 weeks d/c by Dr Dalbert Garnet this am returns for second Betamethasone injection .  Administer 12 mg betamethasone and d/c home . F/up with [facility]

## 2015-07-14 NOTE — Discharge Summary (Signed)
35UY [id number] at 28+5wks admitted to observation with regular and frequent preterm contractions and vaginal discharge. Cat I strip throughout Serial cervical exams noted persistently closed cervix; at time of discharge, cervix closed/thick and high.  Flagyl started and script sent home with patient Terbutaline given x1 for sx relief. BMZ x1 given in house; patient to return in 24hr for second dose ppx. Indomethacin started for tocolysis x48hrs.  Recent Results (from the past 2160 hour(s))  Rapid HIV screen (HIV 1/2 Ab+Ag) ([facility] Only)     Status: None   Collection Time: 07/13/15 10:13 PM  Result Value Ref Range   HIV-1 P24 Antigen - HIV24 NON REACTIVE NON REACTIVE   HIV 1/2 Antibodies NON REACTIVE NON REACTIVE   Interpretation (HIV Ag Ab)      A non reactive test result means that HIV 1 or HIV 2 antibodies and HIV 1 p24 antigen were not detected in the specimen.  Chlamydia/NGC rt PCR ([facility] only)     Status: None   Collection Time: 07/13/15 10:13 PM  Result Value Ref Range   Specimen source GC/Chlam ENDOCERVICAL    Chlamydia Tr NOT DETECTED NOT DETECTED   N gonorrhoeae NOT DETECTED NOT DETECTED    Comment: (NOTE) 100  This methodology has not been evaluated in pregnant women or in 200  patients with a history of hysterectomy. 300 400  This methodology will not be performed on patients less than 36  years of age.   Urine Drug Screen, Qualitative ([facility] only)     Status: None   Collection Time: 07/13/15 10:13 PM  Result Value Ref Range   Tricyclic, Ur Screen NONE DETECTED NONE DETECTED   Amphetamines, Ur Screen NONE DETECTED NONE DETECTED   MDMA (Ecstasy)Ur Screen NONE DETECTED NONE DETECTED   Cocaine Metabolite,Ur [place] NONE DETECTED NONE DETECTED   Opiate, Ur Screen NONE DETECTED NONE DETECTED   Phencyclidine (PCP) Ur S NONE DETECTED NONE DETECTED   Cannabinoid 50 Ng, Ur [place] NONE DETECTED NONE DETECTED   Barbiturates, Ur Screen NONE DETECTED NONE DETECTED   Benzodiazepine, Ur Scrn  NONE DETECTED NONE DETECTED   Methadone Scn, Ur NONE DETECTED NONE DETECTED    Comment: (NOTE) 595  Tricyclics, urine               Cutoff 1000 ng/mL 200  Amphetamines, urine             Cutoff 1000 ng/mL 300  MDMA (Ecstasy), urine           Cutoff 500 ng/mL 400  Cocaine Metabolite, urine       Cutoff 300 ng/mL 500  Opiate, urine                   Cutoff 300 ng/mL 600  Phencyclidine (PCP), urine      Cutoff 25 ng/mL 700  Cannabinoid, urine              Cutoff 50 ng/mL 800  Barbiturates, urine             Cutoff 200 ng/mL 900  Benzodiazepine, urine           Cutoff 200 ng/mL 1000 Methadone, urine                Cutoff 300 ng/mL 1100 1200 The urine drug screen provides only a preliminary, unconfirmed 1300 analytical test result and should not be used for non-medical 1400 purposes. Clinical consideration and professional judgment should 1500 be applied to any positive drug screen  result due to possible 1600 interfering substances. A more specific alternate chemical method 1700 must be used in order to obtain a confirmed analytical result.  1800 Gas chromato graphy / mass spectrometry (GC/MS) is the preferred 1900 confirmatory method.   Urinalysis complete, with microscopic ([facility] only)     Status: Abnormal   Collection Time: 07/13/15 10:13 PM  Result Value Ref Range   Color, Urine YELLOW (A) YELLOW   APPearance CLEAR (A) CLEAR   Glucose, UA NEGATIVE NEGATIVE mg/dL   Bilirubin Urine NEGATIVE NEGATIVE   Ketones, ur NEGATIVE NEGATIVE mg/dL   Specific Gravity, Urine 1.026 1.005 - 1.030   Hgb urine dipstick NEGATIVE NEGATIVE   pH 6.0 5.0 - 8.0   Protein, ur NEGATIVE NEGATIVE mg/dL   Nitrite NEGATIVE NEGATIVE   Leukocytes, UA 1+ (A) NEGATIVE   RBC / HPF 0-5 0 - 5 RBC/hpf   WBC, UA 0-5 0 - 5 WBC/hpf   Bacteria, UA NONE SEEN NONE SEEN   Squamous Epithelial / LPF 0-5 (A) NONE SEEN   Mucous PRESENT   Comprehensive metabolic panel     Status: Abnormal   Collection Time: 07/13/15 10:13 PM   Result Value Ref Range   Sodium 138 135 - 145 mmol/L   Potassium 3.7 3.5 - 5.1 mmol/L   Chloride 110 101 - 111 mmol/L   CO2 21 (L) 22 - 32 mmol/L   Glucose, Bld 84 65 - 99 mg/dL   BUN 8 6 - 20 mg/dL   Creatinine, Ser 0.41 (L) 0.44 - 1.00 mg/dL   Calcium 8.4 (L) 8.9 - 10.3 mg/dL   Total Protein 6.1 (L) 6.5 - 8.1 g/dL   Albumin 2.8 (L) 3.5 - 5.0 g/dL   AST 18 15 - 41 U/L   ALT 10 (L) 14 - 54 U/L   Alkaline Phosphatase 70 38 - 126 U/L   Total Bilirubin 0.2 (L) 0.3 - 1.2 mg/dL   GFR calc non Af Amer >60 >60 mL/min   GFR calc Af Amer >60 >60 mL/min    Comment: (NOTE) The eGFR has been calculated using the CKD EPI equation. This calculation has not been validated in all clinical situations. eGFR's persistently <60 mL/min signify possible Chronic Kidney Disease.    Anion gap 7 5 - 15  CBC     Status: Abnormal   Collection Time: 07/13/15 10:13 PM  Result Value Ref Range   WBC 7.4 3.6 - 11.0 K/uL   RBC 3.85 3.80 - 5.20 MIL/uL   Hemoglobin 10.7 (L) 12.0 - 16.0 g/dL   HCT 31.8 (L) 35.0 - 47.0 %   MCV 82.6 80.0 - 100.0 fL   MCH 27.9 26.0 - 34.0 pg   MCHC 33.7 32.0 - 36.0 g/dL   RDW 12.6 11.5 - 14.5 %   Platelets 206 150 - 440 K/uL  Wet prep, genital     Status: Abnormal   Collection Time: 07/13/15 10:13 PM  Result Value Ref Range   Yeast Wet Prep HPF POC NONE SEEN NONE SEEN   Trich, Wet Prep NONE SEEN NONE SEEN   Clue Cells Wet Prep HPF POC PRESENT (A) NONE SEEN   WBC, Wet Prep HPF POC MANY (A) NONE SEEN   Sperm NONE SEEN   Fetal fibronectin     Status: None   Collection Time: 07/13/15 10:13 PM  Result Value Ref Range   Fetal Fibronectin NEGATIVE NEGATIVE   Appearance, FETFIB CLEAR CLEAR   Patient discharged home in stable condition. Strict preterm  labor instructions given.

## 2015-07-14 NOTE — OB Triage Provider Note (Addendum)
25yo [id number] at 28+5wks Pt reevaluated because she called out with pain with the monitor-confirmed contractions. Notes nausea with heartburn with contractions, lower back pain and upper abdominal pain.   Contractions noted on the monitor q3-4 min, with small respites in between runs of contractions.  No fundal tenderness, no CVA tenderness, no fever. Cervix unchanged - internal os closed, thick and high, soft and anterior.  A/P: Preterm contractions, BV, GERD  - Continue tocolysis with indomethacin. Will give 1 dose terbutaline for sx management. - Continue Flagyl for tx of BV - No evidence of other infection, systemic or intrauterine. - Plan for second dose of steroids at 10pm - Will try T3 for pain control. - If continued painful contractions, 2nd line magnesium tocolysis considered, and certainly will start for neuro-prophalaxsis if cervical dilation.

## 2015-07-14 NOTE — Progress Notes (Addendum)
RN called MD (Schermerhorn) to report patient here for 2nd dose of BMZ, no order in computer. Dr. Francena Hanly note read to MD. Pt. Asked about contractions, pt. States no contractions, but still has right side pain ("but a lot less than yesterday").  Pt. States she is not feeling any contractions. Pt. Taking  Indomethacin and Flagyl as instructed.  Dr. Feliberto Gottron to put in order for BMZ.  Pt. Has follow up appt. with [facility]. (where she receives prenatal care) on March 8th. Pt. States, "I am feeling baby move."

## 2015-07-14 NOTE — OB Triage Provider Note (Signed)
Labs have returned normal with a nget fetal fibronectin but positive for bacterial vaginiosis. Po flagyl started. 2nd betamethasone shot tomorrow planned. Continuing to contract q2-4 min. Loading dose of indomethicin started. No mag for neuroprotection as recheck of cervix notes no change. While contracting will continue to monitor for now.

## 2015-07-16 LAB — CULTURE, BETA STREP (GROUP B ONLY)

## 2015-09-29 ENCOUNTER — Inpatient Hospital Stay
Admission: EM | Admit: 2015-09-29 | Discharge: 2015-10-02 | DRG: 767 | Disposition: A | Discharge status: Home / Self Care | Payer: Medicaid Other | Attending: Obstetrics and Gynecology | Admitting: Obstetrics and Gynecology

## 2015-09-29 DIAGNOSIS — E669 Obesity, unspecified: Secondary | ICD-10-CM | POA: Diagnosis present

## 2015-09-29 DIAGNOSIS — O9902 Anemia complicating childbirth: Secondary | ICD-10-CM | POA: Diagnosis not present

## 2015-09-29 DIAGNOSIS — Z8279 Family history of other congenital malformations, deformations and chromosomal abnormalities: Secondary | ICD-10-CM

## 2015-09-29 DIAGNOSIS — O99214 Obesity complicating childbirth: Secondary | ICD-10-CM | POA: Diagnosis present

## 2015-09-29 DIAGNOSIS — Z302 Encounter for sterilization: Secondary | ICD-10-CM

## 2015-09-29 DIAGNOSIS — E872 Acidosis: Secondary | ICD-10-CM | POA: Diagnosis not present

## 2015-09-29 DIAGNOSIS — O99284 Endocrine, nutritional and metabolic diseases complicating childbirth: Secondary | ICD-10-CM | POA: Diagnosis present

## 2015-09-29 DIAGNOSIS — D509 Iron deficiency anemia, unspecified: Secondary | ICD-10-CM | POA: Diagnosis present

## 2015-09-29 DIAGNOSIS — F329 Major depressive disorder, single episode, unspecified: Secondary | ICD-10-CM | POA: Diagnosis present

## 2015-09-29 DIAGNOSIS — Z3A39 39 weeks gestation of pregnancy: Secondary | ICD-10-CM

## 2015-09-29 DIAGNOSIS — Z6832 Body mass index (BMI) 32.0-32.9, adult: Secondary | ICD-10-CM

## 2015-09-29 DIAGNOSIS — O99344 Other mental disorders complicating childbirth: Secondary | ICD-10-CM | POA: Diagnosis present

## 2015-09-29 MED ORDER — ACETAMINOPHEN 500 MG PO TABS
1000.0000 mg | ORAL_TABLET | Freq: Once | ORAL | Status: AC
  Administered 2015-09-29: 1000 mg via ORAL

## 2015-09-29 MED ORDER — ACETAMINOPHEN 500 MG PO TABS
ORAL_TABLET | ORAL | Status: AC
  Administered 2015-09-29: 1000 mg via ORAL
  Filled 2015-09-29: qty 2

## 2015-09-29 NOTE — OB Triage Note (Signed)
Pt presents to L&D with c/o back pain and contractions since 1 pm. Reports good fetal movement. Denies sexual intercourse in the last 24 hours. Hooked up to monitor

## 2015-09-30 ENCOUNTER — Encounter: Payer: Self-pay | Admitting: *Deleted

## 2015-09-30 DIAGNOSIS — O99214 Obesity complicating childbirth: Secondary | ICD-10-CM | POA: Diagnosis present

## 2015-09-30 DIAGNOSIS — Z302 Encounter for sterilization: Secondary | ICD-10-CM | POA: Diagnosis not present

## 2015-09-30 DIAGNOSIS — Z3A39 39 weeks gestation of pregnancy: Secondary | ICD-10-CM | POA: Diagnosis not present

## 2015-09-30 DIAGNOSIS — F329 Major depressive disorder, single episode, unspecified: Secondary | ICD-10-CM | POA: Diagnosis present

## 2015-09-30 DIAGNOSIS — O9902 Anemia complicating childbirth: Secondary | ICD-10-CM | POA: Diagnosis not present

## 2015-09-30 DIAGNOSIS — O99284 Endocrine, nutritional and metabolic diseases complicating childbirth: Secondary | ICD-10-CM | POA: Diagnosis present

## 2015-09-30 DIAGNOSIS — E872 Acidosis: Secondary | ICD-10-CM | POA: Diagnosis not present

## 2015-09-30 DIAGNOSIS — D509 Iron deficiency anemia, unspecified: Secondary | ICD-10-CM | POA: Diagnosis present

## 2015-09-30 DIAGNOSIS — O99344 Other mental disorders complicating childbirth: Secondary | ICD-10-CM | POA: Diagnosis present

## 2015-09-30 DIAGNOSIS — E669 Obesity, unspecified: Secondary | ICD-10-CM | POA: Diagnosis present

## 2015-09-30 DIAGNOSIS — Z6832 Body mass index (BMI) 32.0-32.9, adult: Secondary | ICD-10-CM | POA: Diagnosis not present

## 2015-09-30 LAB — CBC
HCT: 32.7 % — ABNORMAL LOW (ref 35.0–47.0)
Hemoglobin: 10.9 g/dL — ABNORMAL LOW (ref 12.0–16.0)
MCH: 25.5 pg — ABNORMAL LOW (ref 26.0–34.0)
MCHC: 33.3 g/dL (ref 32.0–36.0)
MCV: 76.7 fL — AB (ref 80.0–100.0)
PLATELETS: 207 10*3/uL (ref 150–440)
RBC: 4.27 MIL/uL (ref 3.80–5.20)
RDW: 14.4 % (ref 11.5–14.5)
WBC: 6 10*3/uL (ref 3.6–11.0)

## 2015-09-30 LAB — TYPE AND SCREEN
ABO/RH(D): O POS
Antibody Screen: NEGATIVE

## 2015-09-30 LAB — ABO/RH: ABO/RH(D): O POS

## 2015-09-30 MED ORDER — OXYTOCIN 40 UNITS IN LACTATED RINGERS INFUSION - SIMPLE MED
2.5000 [IU]/h | INTRAVENOUS | Status: DC
  Administered 2015-09-30: 2.5 [IU]/h via INTRAVENOUS
  Administered 2015-09-30: 500 [IU]/h via INTRAVENOUS
  Filled 2015-09-30: qty 1000

## 2015-09-30 MED ORDER — BISACODYL 10 MG RE SUPP: 10.0000 mg | Freq: Every day | RECTAL | Status: DC | PRN

## 2015-09-30 MED ORDER — PRENATAL MULTIVITAMIN CH
1.0000 | ORAL_TABLET | Freq: Every day | ORAL | Status: DC
  Administered 2015-09-30 – 2015-10-02 (×3): 1 via ORAL
  Filled 2015-09-30 (×2): qty 1

## 2015-09-30 MED ORDER — ZOLPIDEM TARTRATE 5 MG PO TABS: 5.0000 mg | ORAL_TABLET | Freq: Every evening | ORAL | Status: DC | PRN

## 2015-09-30 MED ORDER — OXYCODONE-ACETAMINOPHEN 5-325 MG PO TABS: 1.0000 | ORAL_TABLET | ORAL | Status: DC | PRN

## 2015-09-30 MED ORDER — SENNOSIDES-DOCUSATE SODIUM 8.6-50 MG PO TABS
2.0000 | ORAL_TABLET | ORAL | Status: DC
  Administered 2015-10-02: 2 via ORAL
  Filled 2015-09-30: qty 2

## 2015-09-30 MED ORDER — SIMETHICONE 80 MG PO CHEW: 80.0000 mg | CHEWABLE_TABLET | ORAL | Status: DC | PRN

## 2015-09-30 MED ORDER — DIBUCAINE 1 % RE OINT: 1.0000 "application " | TOPICAL_OINTMENT | RECTAL | Status: DC | PRN

## 2015-09-30 MED ORDER — FLEET ENEMA 7-19 GM/118ML RE ENEM: 1.0000 | ENEMA | Freq: Every day | RECTAL | Status: DC | PRN

## 2015-09-30 MED ORDER — LIDOCAINE HCL (PF) 1 % IJ SOLN: 30.0000 mL | INTRAMUSCULAR | Status: DC | PRN

## 2015-09-30 MED ORDER — OXYTOCIN 10 UNIT/ML IJ SOLN
INTRAMUSCULAR | Status: AC
  Filled 2015-09-30: qty 2

## 2015-09-30 MED ORDER — SODIUM CHLORIDE 0.9 % IV SOLN: 250.0000 mL | INTRAVENOUS | Status: DC | PRN

## 2015-09-30 MED ORDER — ONDANSETRON HCL 4 MG/2ML IJ SOLN: 4.0000 mg | INTRAMUSCULAR | Status: DC | PRN

## 2015-09-30 MED ORDER — ONDANSETRON HCL 4 MG/2ML IJ SOLN: 4.0000 mg | Freq: Four times a day (QID) | INTRAMUSCULAR | Status: DC | PRN

## 2015-09-30 MED ORDER — ACETAMINOPHEN 325 MG PO TABS: 650.0000 mg | ORAL_TABLET | ORAL | Status: DC | PRN

## 2015-09-30 MED ORDER — TETANUS-DIPHTH-ACELL PERTUSSIS 5-2.5-18.5 LF-MCG/0.5 IM SUSP: 0.5000 mL | Freq: Once | INTRAMUSCULAR | Status: DC

## 2015-09-30 MED ORDER — SODIUM CHLORIDE 0.9% FLUSH: 3.0000 mL | INTRAVENOUS | Status: DC | PRN

## 2015-09-30 MED ORDER — DIPHENHYDRAMINE HCL 25 MG PO CAPS: 25.0000 mg | ORAL_CAPSULE | Freq: Four times a day (QID) | ORAL | Status: DC | PRN

## 2015-09-30 MED ORDER — LACTATED RINGERS IV SOLN
INTRAVENOUS | Status: DC
  Administered 2015-09-30: 125 mL/h via INTRAVENOUS
  Administered 2015-09-30 – 2015-10-01 (×4): via INTRAVENOUS

## 2015-09-30 MED ORDER — MEASLES, MUMPS & RUBELLA VAC ~~LOC~~ INJ
0.5000 mL | INJECTION | Freq: Once | SUBCUTANEOUS | Status: DC
  Filled 2015-09-30: qty 0.5

## 2015-09-30 MED ORDER — COCONUT OIL OIL
1.0000 | TOPICAL_OIL | Status: DC | PRN
Start: 2015-09-30 — End: 2015-10-03

## 2015-09-30 MED ORDER — ONDANSETRON HCL 4 MG PO TABS: 4.0000 mg | ORAL_TABLET | ORAL | Status: DC | PRN

## 2015-09-30 MED ORDER — IBUPROFEN 600 MG PO TABS
600.0000 mg | ORAL_TABLET | Freq: Four times a day (QID) | ORAL | Status: DC
  Administered 2015-09-30 – 2015-10-02 (×9): 600 mg via ORAL
  Filled 2015-09-30 (×9): qty 1

## 2015-09-30 MED ORDER — BUTORPHANOL TARTRATE 1 MG/ML IJ SOLN: 1.0000 mg | INTRAMUSCULAR | Status: DC | PRN

## 2015-09-30 MED ORDER — MISOPROSTOL 200 MCG PO TABS
ORAL_TABLET | ORAL | Status: AC
  Filled 2015-09-30: qty 4

## 2015-09-30 MED ORDER — LACTATED RINGERS IV SOLN
500.0000 mL | INTRAVENOUS | Status: DC | PRN
  Administered 2015-09-30: 500 mL via INTRAVENOUS

## 2015-09-30 MED ORDER — OXYCODONE-ACETAMINOPHEN 5-325 MG PO TABS
2.0000 | ORAL_TABLET | ORAL | Status: DC | PRN
  Administered 2015-09-30: 2 via ORAL
  Filled 2015-09-30: qty 2

## 2015-09-30 MED ORDER — CITRIC ACID-SODIUM CITRATE 334-500 MG/5ML PO SOLN: 30.0000 mL | ORAL | Status: DC | PRN

## 2015-09-30 MED ORDER — BENZOCAINE-MENTHOL 20-0.5 % EX AERO: 1.0000 "application " | INHALATION_SPRAY | CUTANEOUS | Status: DC | PRN

## 2015-09-30 MED ORDER — LIDOCAINE HCL (PF) 1 % IJ SOLN
INTRAMUSCULAR | Status: AC
  Filled 2015-09-30: qty 30

## 2015-09-30 MED ORDER — AMMONIA AROMATIC IN INHA
RESPIRATORY_TRACT | Status: AC
  Filled 2015-09-30: qty 10

## 2015-09-30 MED ORDER — OXYTOCIN BOLUS FROM INFUSION: 500.0000 mL | INTRAVENOUS | Status: DC

## 2015-09-30 MED ORDER — SODIUM CHLORIDE 0.9% FLUSH: 3.0000 mL | Freq: Two times a day (BID) | INTRAVENOUS | Status: DC

## 2015-09-30 MED ORDER — WITCH HAZEL-GLYCERIN EX PADS: 1.0000 "application " | MEDICATED_PAD | CUTANEOUS | Status: DC | PRN

## 2015-09-30 NOTE — Progress Notes (Signed)
2 min prolonged decel to 60s, pt. Turned to LL-RL, 02 @10L  via face mask, 500ml bolus; FHR return to 120s with nursing interventions.

## 2015-09-30 NOTE — Lactation Note (Signed)
Lactation Consultation Note  Patient Name: Christine SpearingBlanca Faison ZOXWR'UToday's Date: 09/30/2015 Reason for consult: Initial assessment   Maternal Data  interpreter declined.  Feeding Feeding Type: Breast Fed  LATCH Score/Interventions Latch: Grasps breast easily, tongue down, lips flanged, rhythmical sucking.  Audible Swallowing: Spontaneous and intermittent     Comfort (Breast/Nipple): Soft / non-tender     Hold (Positioning): Assistance needed to correctly position infant at breast and maintain latch. Intervention(s): Breastfeeding basics reviewed;Support Pillows     Lactation Tools Discussed/Used     Consult Status Consult Status: Follow-up    Trudee GripCarolyn P Arly Salminen 09/30/2015, 11:20 AM

## 2015-09-30 NOTE — Progress Notes (Addendum)
Christine SpearingBlanca Clark is a 25 y.o. [phone] at 5150w6d  admitted for labor. She is tol UC's well.   Subjective: Feels UC's are strong  Objective: BP 104/63 mmHg  Pulse 72  Temp(Src) 98.1 F (36.7 C) (Oral)  Resp 16  Ht 5\' 1"  (1.549 m)  Wt 89.359 kg (197 lb)  BMI 37.24 kg/m2  LMP 12/25/2014      FHT:  +accels, mod variability, CAT 1 now, At 0732 had  2 min decel to 60's. On Rt side with no further decels.  UC:   q 3-9 mins, mod to palp SVE:   Dilation: 7 Effacement (%): 80 Station: 0 Exam by:: Centex CorporationMillner, RN   Labs: Lab Results  Component Value Date   WBC 6.0 09/30/2015   HGB 10.9* 09/30/2015   HCT 32.7* 09/30/2015   MCV 76.7* 09/30/2015   PLT 207 09/30/2015    Assessment / Plan: Spontaneous labor, progressing normally  Labor: Progressing normally Preeclampsia:  none Fetal Wellbeing:  Category I Pain Control:  Doing well at present O2 and fluid resuscitation as needed for decels. Anticipated MOD:  NSVD P: PP BTL Sharee Pimplearon W Jones 09/30/2015, 8:31 AM

## 2015-09-30 NOTE — Progress Notes (Signed)
MD Check:  4 min prolonged decel to 60's, spont recovery. O2 on, IVH started, left lateral position, then right SVE 5/60/-2, AROM'd clear fluid ?tinge of meconium, FSE placed  25yo [id number] @ 39+6wks here for spontaneous labor with cat 2 tracing. Presence of accels and mod var preclude metabolic acidemia. Cont uterine resuscitation. Anticipate NSVD.   [name], MD

## 2015-09-30 NOTE — Plan of Care (Signed)
Assisted pt up to BR. Pt unable to void. Bladder nopt distended. Peri care give.  Pt transferred to MB floor

## 2015-09-30 NOTE — Progress Notes (Addendum)
Pt arrived to mother/baby at 1410 transported by Georgiana ShoreSandra Greene RN.  Report given; pt reports left leg feeling heavy and made Georgiana Shoresandra Greene aware of difficulty "moving leg to get in and out of bed" Georgiana ShoreSandra Greene to notify C.Jones at this time. Slight swelling of left upper leg noticed on admission.  Will assess pulses for any further issues.

## 2015-09-30 NOTE — Progress Notes (Signed)
Patient ID: Rikki SpearingBlanca Clark, female   DOB: Dec 30, 1990, 25 y.o.   MRN: [id number] Pt will have a PP BTL tomorrow with Dr Tildon HuskyHalfon and will be NPO after midnight

## 2015-09-30 NOTE — H&P (Signed)
MD NOTE:  LMP: 12/25/14 EDD: 10/01/15  HPI: 25 @ 39+6wks here for spontaneous labor. Patient having back cramps. Found to be 3/50/-2. She was being monitored for 2 hours to assess labor when she had a prolonged 5 min decel to 60's with spontaneous recovery. She was checked and found to be 4/50/-2. Comfortable. Does not want epidural.   Past Medical History  Diagnosis Date  . Medical history non-contributory    OB History    Gravida Para Term Preterm AB TAB SAB Ectopic Multiple Living   4 3 3       3      Past Surgical History  Procedure Laterality Date  . No past surgeries     Social History   Social History  . Marital Status: Single    Spouse Name: N/A  . Number of Children: N/A  . Years of Education: N/A   Occupational History  . Not on file.   Social History Main Topics  . Smoking status: Never Smoker   . Smokeless tobacco: Never Used  . Alcohol Use: No  . Drug Use: No  . Sexual Activity: Yes   Other Topics Concern  . Not on file   Social History Narrative   Meds: PNV, iron  APC: [facility] 1. Depression - on zoloft 50mg  daily - needs SW pp 2. Multip for BTL - papers signed in February 3. Iron deficiency anemia - on iron 4. H/o preterm ctx 07/30/15, s/p BMZ x 2 5. Obesity - BMI 32  Labs: 03/10/15 - RPR NR, HCT 36.3, PL 232, UTOX neg, HIV neg, Ucx neg, GC/CT neg. RI, VZV I, Rh + 03/22/15 - 1st tri screen neg 05/12/15 - AFP negative screen 07/08/15 - Hb 10.4, Iron 28, TIBC 481, RPR NR, HIV neg, glucola 97 08/26/15 - PPD neg 4/29 - GC/CT neg, GBS neg, Hb 10.5  O: Filed Vitals:   09/29/15 2214 09/29/15 2218  BP: 113/64 113/64  Pulse: 88   Temp:  97.8 F (36.6 C)  Resp:  16   General appearance: alert, cooperative and mild distress Lungs: clear to auscultation bilaterally Heart: regular rate and rhythm, S1, S2 normal, no murmur, click, rub or gallop Abdomen: soft, non-tender; bowel sounds normal; no masses,  no organomegaly; gravid EFW  3700g Extremities: extremities normal, atraumatic, no cyanosis or edema Skin: Skin color, texture, turgor normal. No rashes or lesions  EFM: 130, mod var, +accels, prolonged decel to nadir of 60's for 5 minutes Toco: irregular, Q3810min  SVE: 4/50/-2 at 12:10am  A/P: 25yo [id number] @ 39+6wks in early labor but with cat 2 tracing, will keep for augmentation and delivery.  - AROM after labs - GBS neg - breast/bottle feeding - wants BTL, papers signed - EFM cont, toco cont - SW pp for depression  Anticipate NSVD  Ala DachJohanna K Maricruz Lucero, MD

## 2015-10-01 LAB — CBC
HCT: 28 % — ABNORMAL LOW (ref 35.0–47.0)
Hemoglobin: 9.2 g/dL — ABNORMAL LOW (ref 12.0–16.0)
MCH: 25.6 pg — ABNORMAL LOW (ref 26.0–34.0)
MCHC: 32.7 g/dL (ref 32.0–36.0)
MCV: 78.4 fL — AB (ref 80.0–100.0)
PLATELETS: 162 10*3/uL (ref 150–440)
RBC: 3.58 MIL/uL — AB (ref 3.80–5.20)
RDW: 14.5 % (ref 11.5–14.5)
WBC: 7.8 10*3/uL (ref 3.6–11.0)

## 2015-10-01 LAB — RPR: RPR Ser Ql: NONREACTIVE

## 2015-10-01 NOTE — Anesthesia Preprocedure Evaluation (Addendum)
Anesthesia Evaluation  Patient identified by MRN, date of birth, ID band Patient awake    Reviewed: Allergy & Precautions, H&P , NPO status , Patient's Chart, lab work & pertinent test results  Airway Mallampati: II  TM Distance: >3 FB Neck ROM: full    Dental  (+) Poor Dentition, Chipped Braces on top teeth:   Pulmonary neg pulmonary ROS, neg shortness of breath,    Pulmonary exam normal        Cardiovascular Exercise Tolerance: Good (-) angina(-) Past MI and (-) DOE negative cardio ROS Normal cardiovascular exam Rhythm:regular Rate:Normal     Neuro/Psych PSYCHIATRIC DISORDERS Depression negative neurological ROS     GI/Hepatic negative GI ROS, Neg liver ROS, neg GERD  ,  Endo/Other  negative endocrine ROS  Renal/GU negative Renal ROS  negative genitourinary   Musculoskeletal   Abdominal   Peds  Hematology negative hematology ROS (+)   Anesthesia Other Findings Past Medical History:   Medical history non-contributory                           Past Surgical History:   NO PAST SURGERIES                                           BMI    Body Mass Index   37.24 kg/m 2     Reproductive/Obstetrics negative OB ROS                          Anesthesia Physical Anesthesia Plan  ASA: III  Anesthesia Plan: General ETT   Post-op Pain Management:    Induction:   Airway Management Planned:   Additional Equipment:   Intra-op Plan:   Post-operative Plan:   Informed Consent: I have reviewed the patients History and Physical, chart, labs and discussed the procedure including the risks, benefits and alternatives for the proposed anesthesia with the patient or authorized representative who has indicated his/her understanding and acceptance.   Dental Advisory Given  Plan Discussed with: CRNA  Anesthesia Plan Comments:        Anesthesia Quick Evaluation

## 2015-10-01 NOTE — Progress Notes (Signed)
Post Partum Day 1 Subjective: no complaints, up ad lib, voiding, tolerating PO and + flatus  Objective: Blood pressure 97/50, pulse 65, temperature 97.6 F (36.4 C), temperature source Oral, resp. rate 20, height 5\' 1"  (1.549 m), weight 89.359 kg (197 lb), last menstrual period 12/25/2014, SpO2 100 %, unknown if currently breastfeeding.  Physical Exam:  General: alert, cooperative and no distress Lochia: appropriate Uterine Fundus: firm DVT Evaluation: No evidence of DVT seen on physical exam.   Recent Labs  09/30/15 0051 10/01/15 0502  HGB 10.9* 9.2*  HCT 32.7* 28.0*    Assessment/Plan: Breastfeeding and Contraception BTL today. R/b/a discussed at length with patient including risks of bleeding/pain/infection/injury to surrounding organs, post operative bleeding requiring blood transfusion or additional surgeries Patient is aware that this is an elective procedure.  Consent in chart.    LOS: 1 day   Ala DachJohanna K Hanson Medeiros 10/01/2015, 7:30 AM

## 2015-10-01 NOTE — Lactation Note (Signed)
This note was copied from a baby's chart. Lactation Consultation Note  Patient Name: Christine Rikki SpearingBlanca Clark XBMWU'XToday's Date: 10/01/2015 Reason for consult: Initial assessment;Other (Comment) (low blood sugars)   Maternal Data Does the patient have breastfeeding experience prior to this delivery?: Yes  Feeding Feeding Type: Breast Milk Length of feed: 30 min Did not observe feeding, states last child nursed for 1 yr.  Mom states this baby is latching and nursing well.  If next blood is ok, she will not need to supplement. LATCH Score/Interventions                      Lactation Tools Discussed/Used WIC Program: Yes   Consult Status Consult Status: PRN    [name] 10/01/2015, 5:01 PM

## 2015-10-02 ENCOUNTER — Inpatient Hospital Stay: Payer: Medicaid Other | Admitting: Anesthesiology

## 2015-10-02 ENCOUNTER — Encounter: Payer: Self-pay | Admitting: Anesthesiology

## 2015-10-02 ENCOUNTER — Encounter
Admission: EM | Disposition: A | Discharge status: Home / Self Care | Payer: Self-pay | Source: Home / Self Care | Attending: Obstetrics and Gynecology

## 2015-10-02 HISTORY — PX: TUBAL LIGATION: SHX77

## 2015-10-02 SURGERY — LIGATION, FALLOPIAN TUBE, POSTPARTUM
Anesthesia: General

## 2015-10-02 MED ORDER — OXYCODONE-ACETAMINOPHEN 5-325 MG PO TABS: 1.0000 | ORAL_TABLET | ORAL | Status: AC | PRN

## 2015-10-02 MED ORDER — KETOROLAC TROMETHAMINE 30 MG/ML IJ SOLN
INTRAMUSCULAR | Status: DC | PRN
  Administered 2015-10-02: 30 mg via INTRAVENOUS

## 2015-10-02 MED ORDER — SUCCINYLCHOLINE CHLORIDE 20 MG/ML IJ SOLN
INTRAMUSCULAR | Status: DC | PRN
  Administered 2015-10-02: 100 mg via INTRAVENOUS

## 2015-10-02 MED ORDER — CEFAZOLIN SODIUM-DEXTROSE 2-3 GM-% IV SOLR
INTRAVENOUS | Status: DC | PRN
  Administered 2015-10-02: 2 g via INTRAVENOUS

## 2015-10-02 MED ORDER — DEXAMETHASONE SODIUM PHOSPHATE 10 MG/ML IJ SOLN
INTRAMUSCULAR | Status: DC | PRN
  Administered 2015-10-02: 10 mg via INTRAVENOUS

## 2015-10-02 MED ORDER — ROCURONIUM BROMIDE 100 MG/10ML IV SOLN
INTRAVENOUS | Status: DC | PRN
  Administered 2015-10-02: 5 mg via INTRAVENOUS

## 2015-10-02 MED ORDER — BUPIVACAINE-EPINEPHRINE 0.25% -1:200000 IJ SOLN
INTRAMUSCULAR | Status: DC | PRN
  Administered 2015-10-02: 4 mL

## 2015-10-02 MED ORDER — FENTANYL CITRATE (PF) 100 MCG/2ML IJ SOLN
25.0000 ug | INTRAMUSCULAR | Status: DC | PRN
  Administered 2015-10-02 (×2): 25 ug via INTRAVENOUS

## 2015-10-02 MED ORDER — FENTANYL CITRATE (PF) 100 MCG/2ML IJ SOLN
INTRAMUSCULAR | Status: AC
  Filled 2015-10-02: qty 2

## 2015-10-02 MED ORDER — PROPOFOL 10 MG/ML IV BOLUS
INTRAVENOUS | Status: DC | PRN
  Administered 2015-10-02: 180 mg via INTRAVENOUS

## 2015-10-02 MED ORDER — ONDANSETRON HCL 4 MG/2ML IJ SOLN
INTRAMUSCULAR | Status: DC | PRN
  Administered 2015-10-02: 4 mg via INTRAVENOUS

## 2015-10-02 MED ORDER — MIDAZOLAM HCL 2 MG/2ML IJ SOLN
INTRAMUSCULAR | Status: DC | PRN
  Administered 2015-10-02: 2 mg via INTRAVENOUS

## 2015-10-02 MED ORDER — LACTATED RINGERS IV SOLN
INTRAVENOUS | Status: DC | PRN
  Administered 2015-10-02: 09:00:00 via INTRAVENOUS

## 2015-10-02 MED ORDER — LIDOCAINE HCL (CARDIAC) 20 MG/ML IV SOLN
INTRAVENOUS | Status: DC | PRN
  Administered 2015-10-02: 100 mg via INTRAVENOUS

## 2015-10-02 MED ORDER — SENNOSIDES-DOCUSATE SODIUM 8.6-50 MG PO TABS: 2.0000 | ORAL_TABLET | ORAL | Status: AC

## 2015-10-02 MED ORDER — ONDANSETRON HCL 4 MG/2ML IJ SOLN: 4.0000 mg | Freq: Once | INTRAMUSCULAR | Status: DC | PRN

## 2015-10-02 MED ORDER — FENTANYL CITRATE (PF) 100 MCG/2ML IJ SOLN
INTRAMUSCULAR | Status: DC | PRN
  Administered 2015-10-02 (×3): 25 ug via INTRAVENOUS
  Administered 2015-10-02: 50 ug via INTRAVENOUS
  Administered 2015-10-02: 25 ug via INTRAVENOUS
  Administered 2015-10-02: 100 ug via INTRAVENOUS

## 2015-10-02 SURGICAL SUPPLY — 31 items
BENZOIN TINCTURE PRP APPL 2/3 (GAUZE/BANDAGES/DRESSINGS) ×3 IMPLANT
BLADE SURG 11 STRL SS SAFETY (MISCELLANEOUS) ×3 IMPLANT
CHLORAPREP W/TINT 26ML (MISCELLANEOUS) ×3 IMPLANT
CLOSURE WOUND 1/2 X4 (GAUZE/BANDAGES/DRESSINGS) ×1
CLOSURE WOUND 1/4X4 (GAUZE/BANDAGES/DRESSINGS)
DRAPE LAPAROTOMY 100X77 ABD (DRAPES) ×3 IMPLANT
DRESSING TELFA 4X3 1S ST N-ADH (GAUZE/BANDAGES/DRESSINGS) ×3 IMPLANT
DRSG TEGADERM 2-3/8X2-3/4 SM (GAUZE/BANDAGES/DRESSINGS) ×3 IMPLANT
ELECT CAUTERY BLADE 6.4 (BLADE) ×3 IMPLANT
ELECT REM PT RETURN 9FT ADLT (ELECTROSURGICAL) ×3
ELECTRODE REM PT RTRN 9FT ADLT (ELECTROSURGICAL) ×1 IMPLANT
GLOVE BIO SURGEON STRL SZ 6 (GLOVE) ×9 IMPLANT
GLOVE BIOGEL PI IND STRL 6.5 (GLOVE) ×1 IMPLANT
GLOVE BIOGEL PI INDICATOR 6.5 (GLOVE) ×2
GOWN STRL REUS W/ TWL LRG LVL3 (GOWN DISPOSABLE) ×2 IMPLANT
GOWN STRL REUS W/TWL LRG LVL3 (GOWN DISPOSABLE) ×4
KIT RM TURNOVER CYSTO AR (KITS) ×3 IMPLANT
LABEL OR SOLS (LABEL) ×3 IMPLANT
NDL SAFETY 22GX1.5 (NEEDLE) ×3 IMPLANT
NS IRRIG 500ML POUR BTL (IV SOLUTION) ×3 IMPLANT
PACK BASIN MINOR ARMC (MISCELLANEOUS) ×3 IMPLANT
RETRACTOR WOUND ALXS 18CM SML (MISCELLANEOUS) ×1 IMPLANT
RTRCTR WOUND ALEXIS O 18CM SML (MISCELLANEOUS) ×3
STRIP CLOSURE SKIN 1/2X4 (GAUZE/BANDAGES/DRESSINGS) ×2 IMPLANT
STRIP CLOSURE SKIN 1/4X4 (GAUZE/BANDAGES/DRESSINGS) IMPLANT
SUT MNCRL AB 4-0 PS2 18 (SUTURE) ×3 IMPLANT
SUT PLAIN GUT 0 (SUTURE) ×6 IMPLANT
SUT VIC AB 2-0 SH 27 (SUTURE) ×2
SUT VIC AB 2-0 SH 27XBRD (SUTURE) ×1 IMPLANT
SUT VICRYL 0 AB UR-6 (SUTURE) ×3 IMPLANT
SYRINGE 10CC LL (SYRINGE) ×3 IMPLANT

## 2015-10-02 NOTE — Progress Notes (Signed)
Discharged home with infant

## 2015-10-02 NOTE — H&P (Signed)
Preop Note:   HPI: 25yo G4P4 PPD#2 s/p NSVD. Plan for BTL today in OR.  Past Medical History  Diagnosis Date  . Medical history non-contributory    OB History    Gravida Para Term Preterm AB TAB SAB Ectopic Multiple Living   4 3 3       3      Past Surgical History  Procedure Laterality Date  . No past surgeries     Social History   Social History  . Marital Status: Single    Spouse Name: N/A  . Number of Children: N/A  . Years of Education: N/A   Occupational History  . Not on file.   Social History Main Topics  . Smoking status: Never Smoker   . Smokeless tobacco: Never Used  . Alcohol Use: No  . Drug Use: No  . Sexual Activity: Yes   Other Topics Concern  . Not on file   Social History Narrative   Meds: PNV, iron  APC: ACHD 1. Depression - on zoloft 50mg  daily - needs SW pp 2. Multip for BTL - papers signed in February 3. Iron deficiency anemia - on iron 4. H/o preterm ctx 07/30/15, s/p BMZ x 2 5. Obesity - BMI 32  O: Filed Vitals:   09/29/15 2214 09/29/15 2218  BP: 113/64 113/64  Pulse: 88   Temp:  97.8 F (36.6 C)  Resp:  16   General appearance: alert, cooperative and mild distress Lungs: clear to auscultation bilaterally Heart: regular rate and rhythm, S1, S2 normal, no murmur, click, rub or gallop Abdomen: soft, non-tender; bowel sounds normal; no masses, no organomegaly; gravid EFW 3700g Extremities: extremities normal, atraumatic, no cyanosis or edema Skin: Skin color, texture, turgor normal. No rashes or lesions     CBC Latest Ref Rng 10/01/2015 09/30/2015 07/13/2015  WBC 3.6 - 11.0 K/uL 7.8 6.0 7.4  Hemoglobin 12.0 - 16.0 g/dL 4.0(J9.2(L) 10.9(L) 10.7(L)  Hematocrit 35.0 - 47.0 % 28.0(L) 32.7(L) 31.8(L)  Platelets 150 - 440 K/uL 162 207 206     Plan: BTL today. R/b/a discussed at length with patient including risks of  bleeding/pain/infection/injury to surrounding organs, post operative bleeding requiring blood transfusion or additional surgeries Patient is aware that this is an elective procedure.  Consent in chart.   Ala DachJohanna K Broden Holt, MD

## 2015-10-02 NOTE — Anesthesia Procedure Notes (Signed)
Procedure Name: Intubation Date/Time: 10/02/2015 8:41 AM Performed by: Clovis FredricksonRISSON, Latonja Bobeck Pre-anesthesia Checklist: Patient identified, Emergency Drugs available, Suction available, Patient being monitored and Timeout performed Patient Re-evaluated:Patient Re-evaluated prior to inductionOxygen Delivery Method: Circle system utilized Preoxygenation: Pre-oxygenation with 100% oxygen Intubation Type: IV induction, Rapid sequence and Cricoid Pressure applied Laryngoscope Size: Mac and 4 Grade View: Grade II Tube type: Oral Tube size: 6.5 mm Number of attempts: 1 Airway Equipment and Method: Stylet and Oral airway Placement Confirmation: ETT inserted through vocal cords under direct vision,  positive ETCO2,  CO2 detector and breath sounds checked- equal and bilateral Secured at: 22 cm Tube secured with: Tape Dental Injury: Teeth and Oropharynx as per pre-operative assessment

## 2015-10-02 NOTE — Discharge Summary (Signed)
OB Discharge Summary     Patient Name: Christine Clark DOB: 12/07/1990 MRN: [id number]  Date of admission: 09/29/2015 Delivering MD: Sharee PimpleJONES, CARON W   Date of discharge: 10/02/2015  Admitting diagnosis: 39 WEEKS CONTRACTION  desires permanent sterility Intrauterine pregnancy: 3486w6d     Secondary diagnosis:  Active Problems:   Normal labor  Additional problems: s/p BTL     Discharge diagnosis: Term Pregnancy Delivered                                                                                                Post partum procedures:postpartum tubal ligation  Augmentation: AROM  Complications: None  Hospital course:  Onset of Labor With Vaginal Delivery     25 y.o. yo G4P4003 at 4086w6d was admitted in Latent Labor on 09/29/2015. Patient had an uncomplicated labor course as follows:  Membrane Rupture Time/Date: 1:05 AM ,09/30/2015   Intrapartum Procedures: Episiotomy:                                           Lacerations:  2nd degree [3]  Patient had a delivery of a Viable infant. 09/30/2015  Information for the patient's newborn:  Sharion SettlerBahenadelgado, Boy Aulani [[id number]][[id number]]  Delivery Method: Vag-Spont    Pateint had an uncomplicated postpartum course.  She is ambulating, tolerating a regular diet, passing flatus, and urinating well. Patient is discharged home in stable condition on 10/02/2015.    Physical exam  Filed Vitals:   10/02/15 1110 10/02/15 1210 10/02/15 1348 10/02/15 1445  BP: 101/65 104/62 102/58 106/64  Pulse: 65 62 74 78  Temp: 98.3 F (36.8 C) 98.2 F (36.8 C)  98.5 F (36.9 C)  TempSrc: Axillary Axillary  Axillary  Resp: 16 18 18 18   Height:      Weight:      SpO2: 93% 95% 93% 94%   General: alert, cooperative and no distress Lochia: appropriate Uterine Fundus: firm Incision: Healing well with no significant drainage DVT Evaluation: No evidence of DVT seen on physical exam. Labs: Lab Results  Component Value Date   WBC 7.8 10/01/2015   HGB  9.2* 10/01/2015   HCT 28.0* 10/01/2015   MCV 78.4* 10/01/2015   PLT 162 10/01/2015   CMP Latest Ref Rng 07/13/2015  Glucose 65 - 99 mg/dL 84  BUN 6 - 20 mg/dL 8  Creatinine 1.020.44 - 7.251.00 mg/dL 3.66(Y0.41(L)  Sodium 403135 - 474145 mmol/L 138  Potassium 3.5 - 5.1 mmol/L 3.7  Chloride 101 - 111 mmol/L 110  CO2 22 - 32 mmol/L 21(L)  Calcium 8.9 - 10.3 mg/dL 2.5(Z8.4(L)  Total Protein 6.5 - 8.1 g/dL 6.1(L)  Total Bilirubin 0.3 - 1.2 mg/dL 5.6(L0.2(L)  Alkaline Phos 38 - 126 U/L 70  AST 15 - 41 U/L 18  ALT 14 - 54 U/L 10(L)    Discharge instruction: per After Visit Summary and "Baby and Me Booklet".  After visit meds:    Medication List    ASK your doctor about these medications  ferrous sulfate 325 (65 FE) MG tablet  Take 325 mg by mouth daily with breakfast.     indomethacin 25 MG capsule  Commonly known as:  INDOCIN  Take 1 capsule (25 mg total) by mouth every 6 (six) hours.     metroNIDAZOLE 500 MG tablet  Commonly known as:  FLAGYL  Take 1 tablet (500 mg total) by mouth every 12 (twelve) hours.     prenatal multivitamin Tabs tablet  Take 1 tablet by mouth daily at 12 noon.     sertraline 25 MG tablet  Commonly known as:  ZOLOFT  Take 25 mg by mouth daily.        Diet: routine diet  Activity: Advance as tolerated. Pelvic rest for 6 weeks.   Outpatient follow up:2 weeks Follow up Appt:No future appointments. Follow up Visit:No Follow-up on file.  Postpartum contraception: Tubal Ligation  Newborn Data: Live born female  Birth Weight: 9 lb 2.7 oz (4160 g) APGAR: 9, 9  Baby Feeding: Bottle and Breast Disposition:home with mother   10/02/2015 Ala Dach, MD

## 2015-10-02 NOTE — Anesthesia Postprocedure Evaluation (Signed)
Anesthesia Post Note  Patient: Christine SpearingBlanca Gibbon  Procedure(s) Performed: Procedure(s) (LRB): POST PARTUM TUBAL LIGATION (N/A)  Patient location during evaluation: PACU Anesthesia Type: General Level of consciousness: awake and alert Pain management: pain level controlled Vital Signs Assessment: post-procedure vital signs reviewed and stable Respiratory status: spontaneous breathing, nonlabored ventilation, respiratory function stable and patient connected to nasal cannula oxygen Cardiovascular status: blood pressure returned to baseline and stable Postop Assessment: no signs of nausea or vomiting Anesthetic complications: no    Last Vitals:  Filed Vitals:   10/02/15 1019 10/02/15 1110  BP:  101/65  Pulse: 53 65  Temp:    Resp: 16     Last Pain:  Filed Vitals:   10/02/15 1110  PainSc: 3                  Jomarie LongsJoseph K Piscitello

## 2015-10-02 NOTE — Addendum Note (Signed)
Addendum  created 10/02/15 1319 by Clovis FredricksonJennifer Vir Whetstine, CRNA   Modules edited: Anesthesia Attestations

## 2015-10-02 NOTE — Discharge Instructions (Signed)
Postpartum Tubal Ligation, Care After Refer to this sheet in the next few weeks. These instructions provide you with information about caring for yourself after your procedure. Your health care provider may also give you more specific instructions. Your treatment has been planned according to current medical practices, but problems sometimes occur. Call your health care provider if you have any problems or questions after your procedure. WHAT TO EXPECT AFTER THE PROCEDURE After your procedure, it is common to have:  Sore throat.  Soreness at the incision site.  Mild cramping.  Tiredness.  Mild nausea or vomiting. HOME CARE INSTRUCTIONS  Rest for the remainder of the day.  Take medicines only as directed by your health care provider. These include over-the-counter medicines and prescription medicines. Do not take aspirin, which can cause bleeding.  Over the next few days, gradually return to your normal activities and your normal diet.  Avoid sexual intercourse for 2 weeks or as directed by your health care provider.  Do not drive or operate heavy machinery while taking pain medicine.  Do not lift anything that is heavier than 5 lb (2.3 kg) for 2 weeks or as directed by your health care provider.  Do not take baths. Take showers only. Ask your health care provider when you can start taking baths.  Take your temperature twice each day and write it down.  Try to have help for the first 7-10 days for your household needs.  There are many different ways to close and cover an incision, including stitches (sutures), skin glue, and adhesive strips. Follow instructions from your health care provider about:  Incision care - let us know if any spreading area of redness develops  Bandage (dressing) changes and removal. - remove top dressing tomorrow, keep steristrips for 1 week   Check your incision area every day for signs of infection. Watch for:  Redness, swelling, or pain.  Fluid,  blood, or pus.  Keep all follow-up visits as directed by your health care provider. SEEK MEDICAL CARE IF:  You have redness, swelling, or increasing pain in your incision area.  You have fluid or pus coming from your incision for longer than 1 day.  You notice a bad smell coming from your incision or your dressing.  The edges of your incision break open after the sutures have been removed.  Your pain does not decrease after 2-3 days.  You have a rash.  You repeatedly become dizzy or light-headed.  You have a reaction to your medicine.  Your pain medicine is not helping.  You are constipated. SEEK IMMEDIATE MEDICAL CARE IF:   You have a fever.  You faint.  You have increasing pain in your abdomen.  You have bleeding or drainage from your suture sites or your vagina after surgery.  You have shortness of breath or have difficulty breathing.  You have chest pain or leg pain.  You have ongoing nausea, vomiting, or diarrhea.   This information is not intended to replace advice given to you by your health care provider. Make sure you discuss any questions you have with your health care provider.   Document Released: 10/31/2011 Document Revised: 09/15/2014 Document Reviewed: 10/31/2011 Elsevier Interactive Patient Education [employer]2016 [employer] Inc[employer]

## 2015-10-02 NOTE — Op Note (Signed)
Christine SpearingBlanca Lozano 09/29/2015 - 10/02/2015  PREOPERATIVE DIAGNOSES: Multiparity, undesired fertility  POSTOPERATIVE DIAGNOSES: Multiparity, undesired fertility  PROCEDURE:  Postpartum Bilateral Tubal Sterilization by modified [place] method  SURGEON: Dr. Burnett CorrenteJohanna Donyell Carrell  ANESTHESIA:  General anesthesia  Anesthesiologist: No responsible provider has been recorded for the case. Anesthesiologist: Rosaria FerriesJoseph K Piscitello, MD CRNA: Clovis FredricksonJennifer Crisson, CRNA  COMPLICATIONS:  None immediate.  ESTIMATED BLOOD LOSS: 5 ml.  FLUIDS: 400 ml LR.   INDICATIONS:  25 y.o. [id number] with undesired fertility,status post vaginal delivery, desires permanent sterilization.  Other reversible forms of contraception were discussed with patient; she declines all other modalities. Risks of procedure discussed with patient including but not limited to: risk of regret, permanence of method, bleeding, infection, injury to surrounding organs and need for additional procedures.  Failure risk of 1 -2 % with increased risk of ectopic gestation if pregnancy occurs was also discussed with patient.      FINDINGS:  Normal uterus, tubes, and ovaries.  PROCEDURE DETAILS: The patient was taken to the operating room where general  anesthesia was done.  She was then placed in the dorsal supine position and prepped and draped in sterile fashion.  After an adequate timeout was performed, attention was turned to the patient's abdomen where a small transverse skin incision was made under the umbilical fold. The incision was taken down to the layer of fascia using the scalpel, and fascia was incised, and extended bilaterally using Mayo scissors. The peritoneum was entered in a sharp fashion. A small alexis retractor was utilized for visualization. Attention was then turned to the patient's uterus, and right fallopian tube was identified and followed out to the fimbriated end. Using 0 plain gut suture, the fimbria were ligated and excised, with  care given to incorporate the underlying mesosalpinx.  A similar process was carried out on the left side allowing for bilateral tubal sterilization.  Good hemostasis was noted overall. The instruments were then removed from the patient's abdomen and the fascial incision was repaired with 0 Vicryl, and the skin was closed with a 4-0 monofilament subcuticular stitch. The patient tolerated the procedure well.  Instrument, sponge, and needle counts were correct times two.  The patient was then taken to the recovery room awake and in stable condition.  Ala DachJohanna K Burman Bruington, MD

## 2015-10-02 NOTE — Transfer of Care (Signed)
Immediate Anesthesia Transfer of Care Note  Patient: Christine Clark  Procedure(s) Performed: Procedure(s): POST PARTUM TUBAL LIGATION (N/A)  Patient Location: PACU  Anesthesia Type:General  Level of Consciousness: awake, alert  and oriented  Airway & Oxygen Therapy: Patient Spontanous Breathing and Patient connected to nasal cannula oxygen  Post-op Assessment: Report given to RN and Post -op Vital signs reviewed and stable  Post vital signs: Reviewed and stable  Last Vitals:  Filed Vitals:   10/02/15 0542 10/02/15 0704  BP:  97/56  Pulse:  59  Temp: 36.7 C 37 C  Resp:  16    Last Pain:  Filed Vitals:   10/02/15 0832  PainSc: 0-No pain         Complications: No apparent anesthesia complications

## 2015-10-02 NOTE — Progress Notes (Signed)
Prenatal records indicate that pt received TDaP vaccine on 07/07/15 and Influenza vaccine on 03/09/15. Reynold BowenSusan Paisley Mouhamadou Gittleman, RN 10/02/2015 3:14 PM

## 2015-10-04 ENCOUNTER — Encounter: Payer: Self-pay | Admitting: Obstetrics and Gynecology

## 2015-10-06 LAB — SURGICAL PATHOLOGY

## 2015-10-29 IMAGING — US US TRANSVAGINAL NON-OB
1 series · 13 of 25 positions shown · non-contrast
Comparison: CT of the abdomen and pelvis from 08/16/2014

CLINICAL DATA: Left lower quadrant abdominal pain, acute onset.
Initial encounter.

EXAM:
TRANSABDOMINAL AND TRANSVAGINAL ULTRASOUND OF PELVIS
DOPPLER ULTRASOUND OF OVARIES
TECHNIQUE: Both transabdominal and transvaginal ultrasound examinations of the
pelvis were performed. Transabdominal technique was performed for
global imaging of the pelvis including uterus, ovaries, adnexal
regions, and pelvic cul-de-sac.
It was necessary to proceed with endovaginal exam following the
transabdominal exam to visualize the endometrium and ovaries in
greater detail. Color and duplex Doppler ultrasound was utilized to
evaluate blood flow to the ovaries.

[Series 1: us transvaginal non-ob · 0.18mm/px · 13 of 200 slices shown]
[im 1/200]
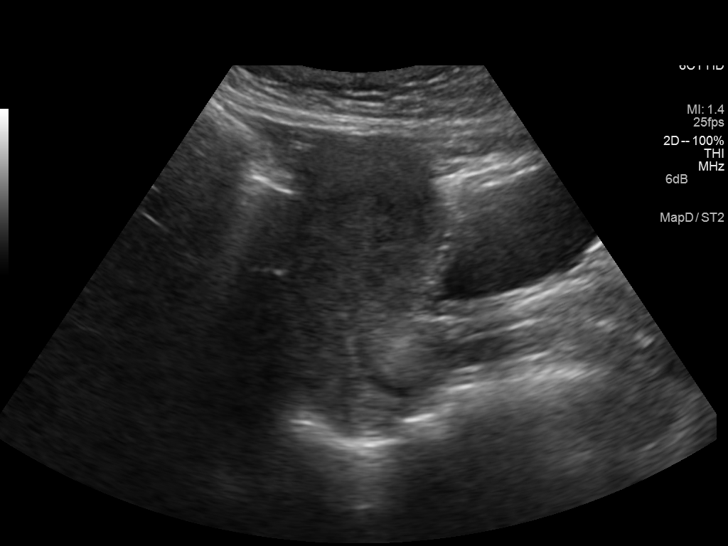
[im 17/200]
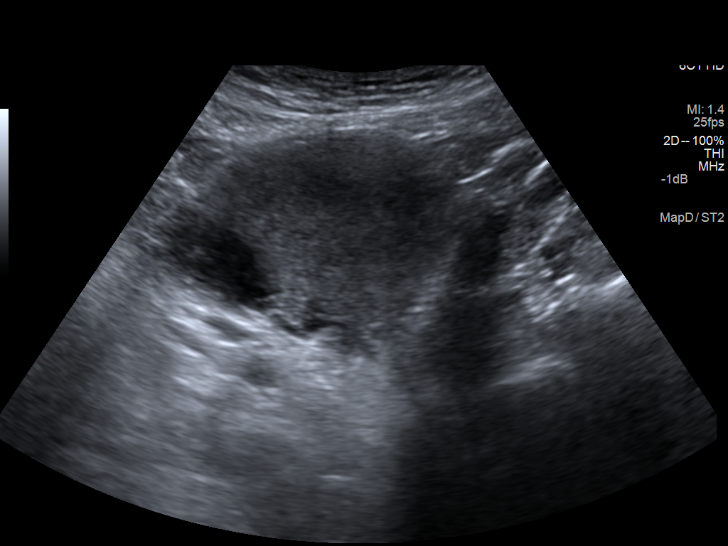
[im 34/200]
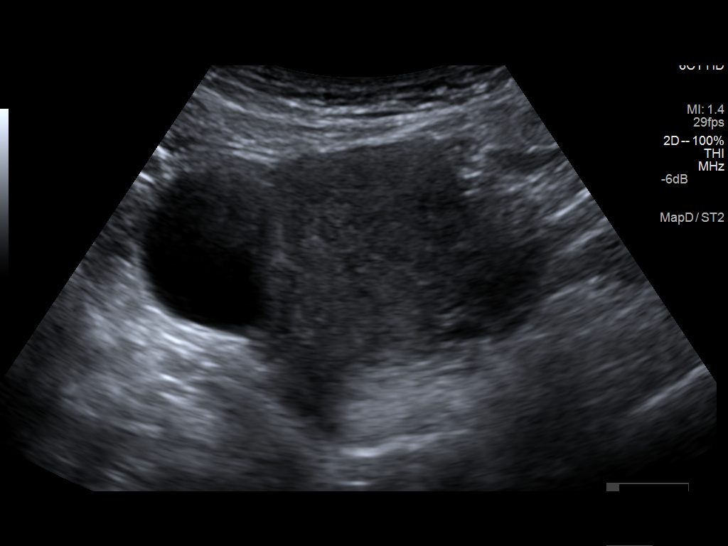
[im 50/200]
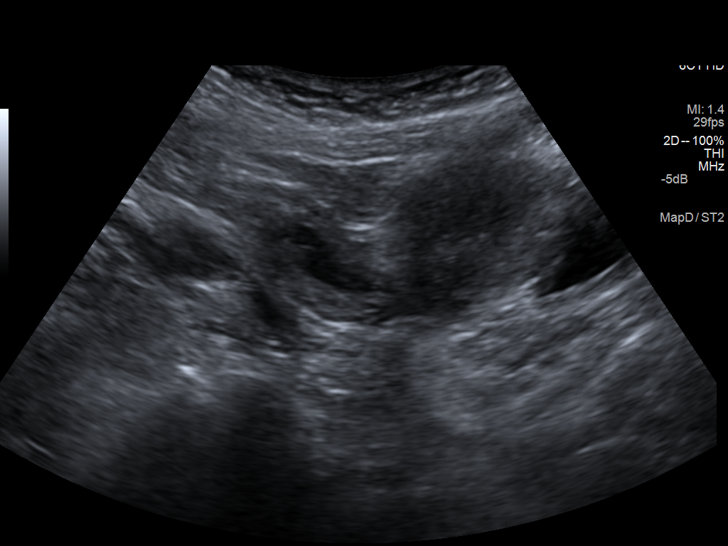
[im 67/200]
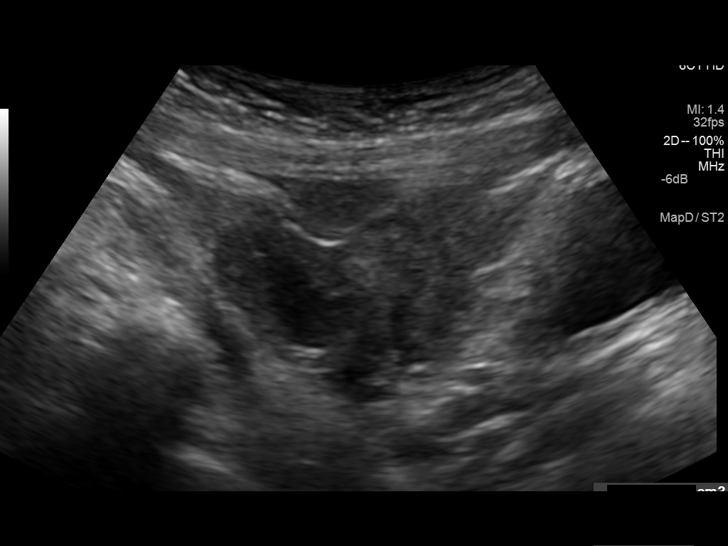
[im 83/200]
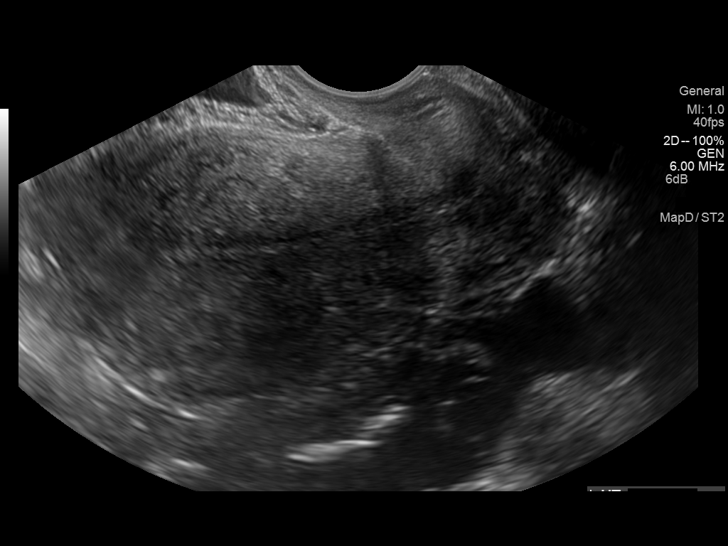
[im 100/200]
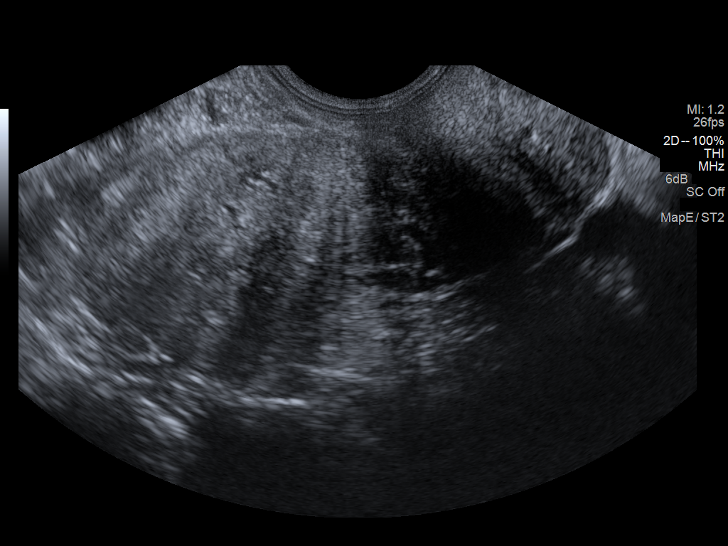
[im 117/200]
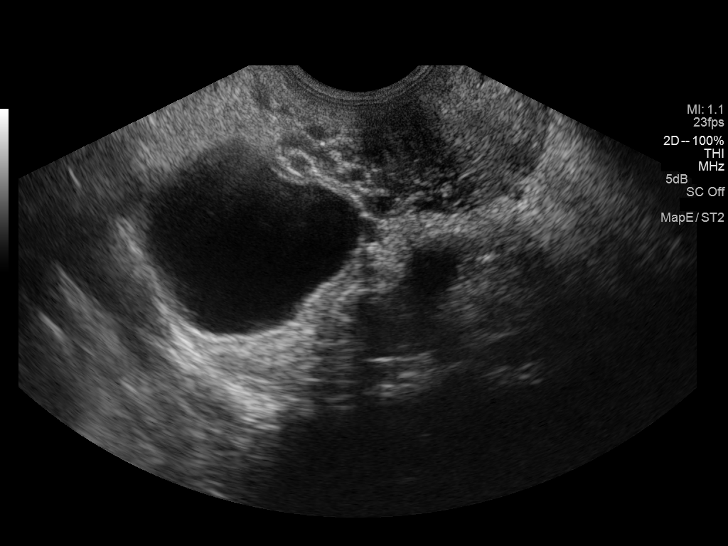
[im 133/200]
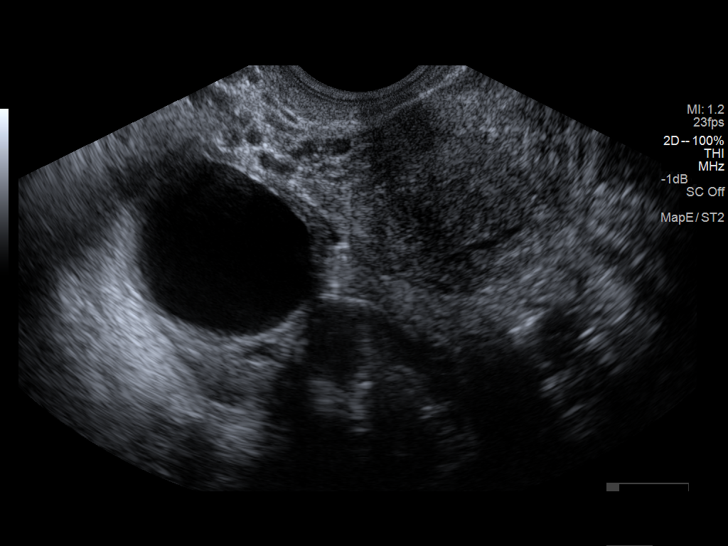
[im 150/200]
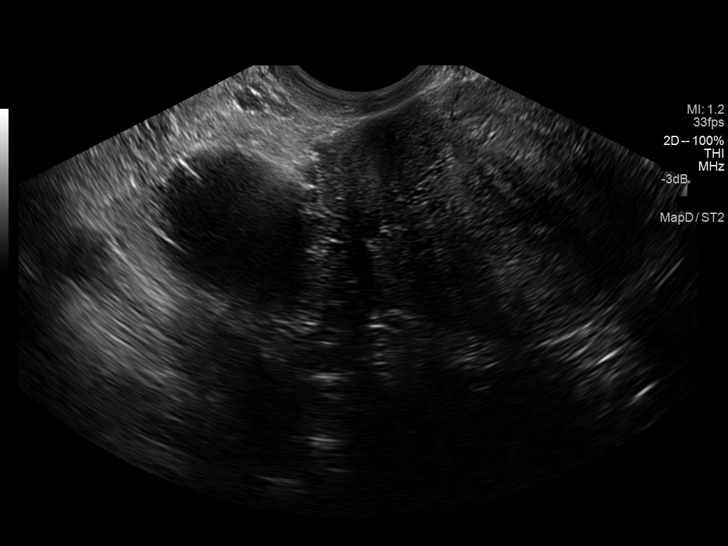
[im 166/200]
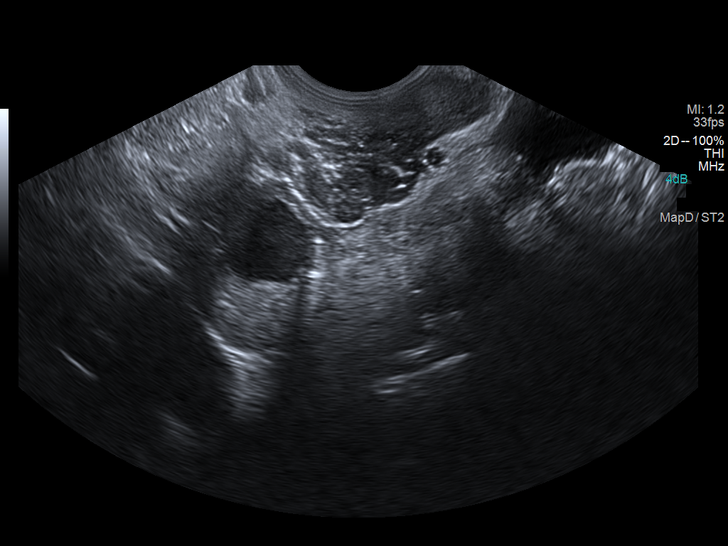
[im 183/200]
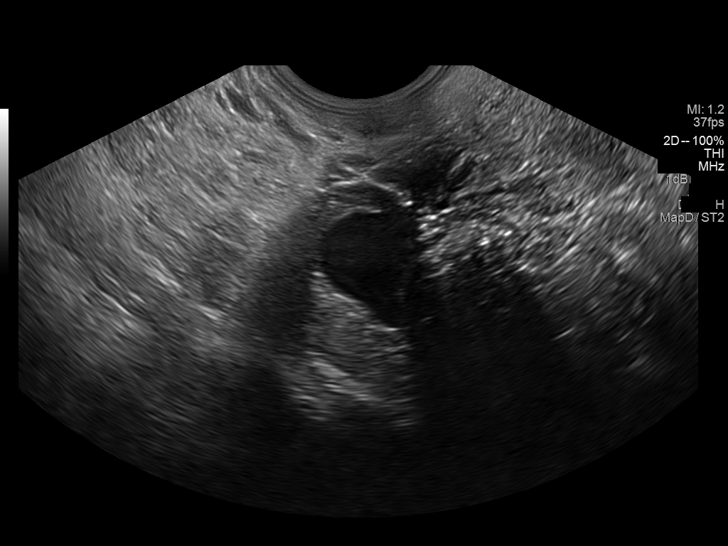
[im 200/200]
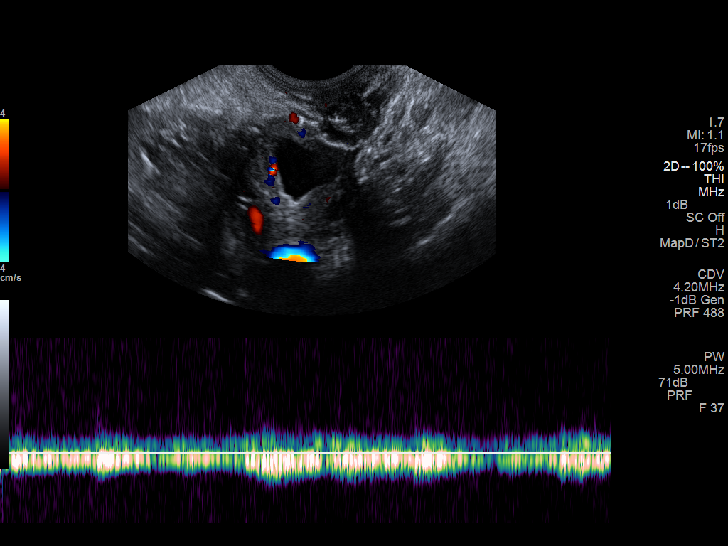

[13 of 25 positions shown; findings below may reference images not displayed]

FINDINGS: Uterus

Measurements: 7.8 x 4.5 x 5.3 cm. No fibroids or other mass
visualized.

Endometrium

Thickness: 0.3 cm.  No focal abnormality visualized.

Right ovary

Measurements: 4.6 x 3.3 x 3.4 cm. A 3.5 x 2.7 x 2.9 cm cyst is
likely physiologic.

Left ovary

Measurements: 3.2 x 2.2 x 1.7 cm. Normal appearance/no adnexal mass.

Pulsed Doppler evaluation of both ovaries demonstrates normal
low-resistance arterial and venous waveforms.

Other findings

A small amount of free fluid is seen within the pelvis.
IMPRESSION: 1. Uterus unremarkable in appearance. No evidence of ovarian
torsion.
2. 3.5 cm right ovarian cyst is likely physiologic.

## 2016-08-30 IMAGING — US US MFM OB DETAIL+14 WK
1 series · 14 of 28 positions shown · non-contrast
Comparison: none

[Series 1: us mfm ob detail+14 wk · 0.22mm/px · 14 of 84 slices shown]
[im 4/84]
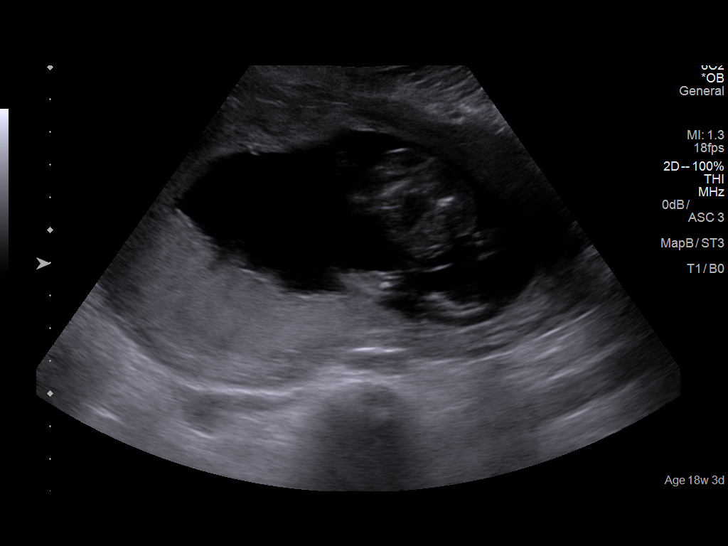
[im 10/84]
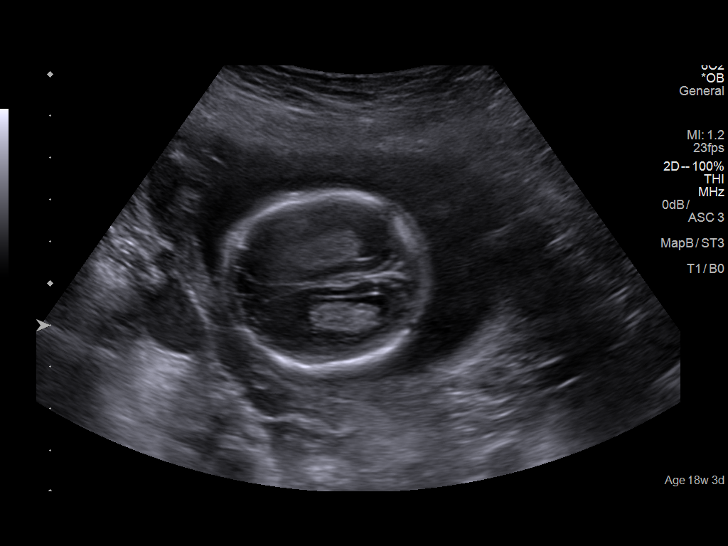
[im 16/84]
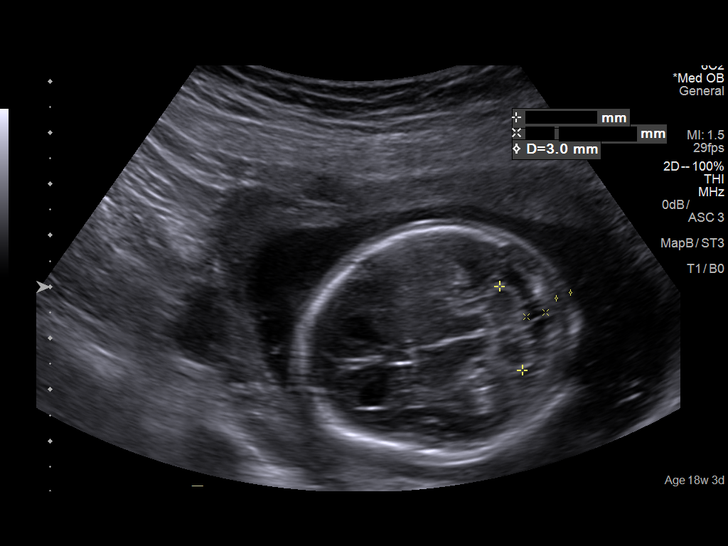
[im 22/84]
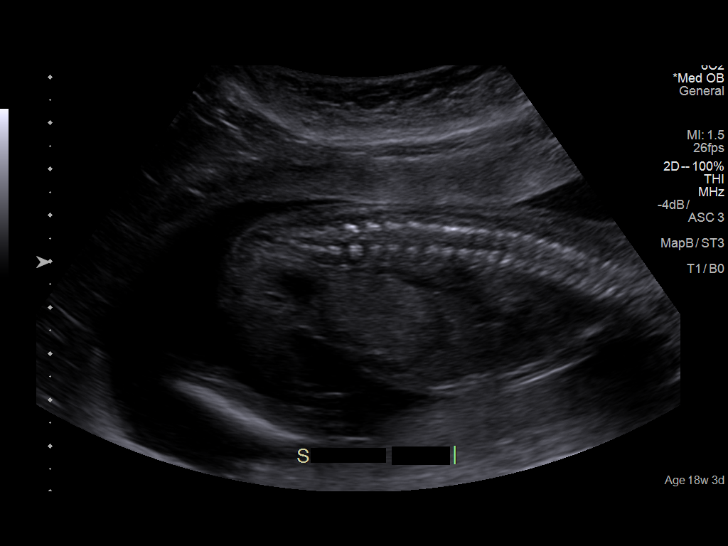
[im 28/84]
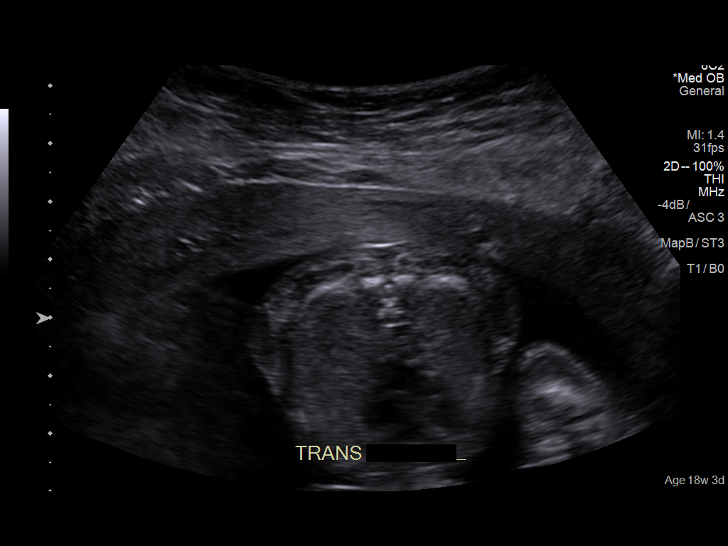
[im 34/84]
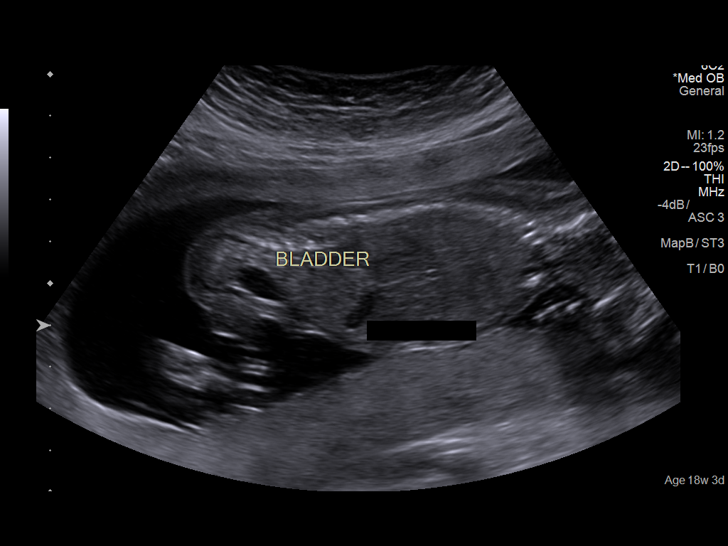
[im 40/84]
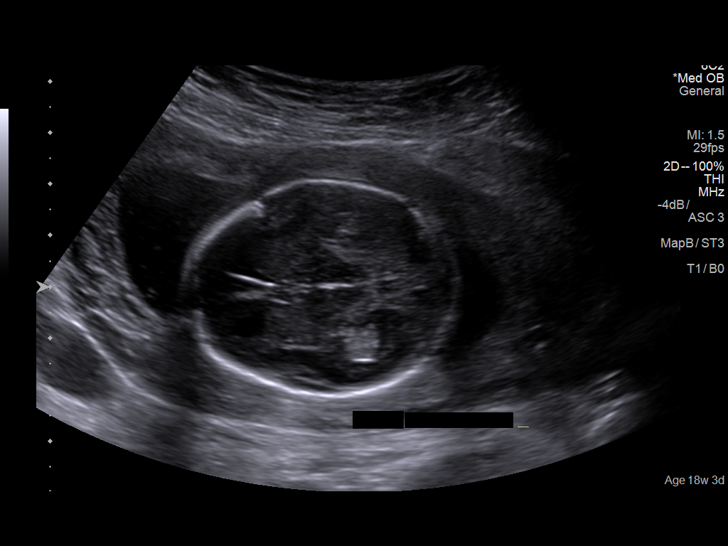
[im 47/84]
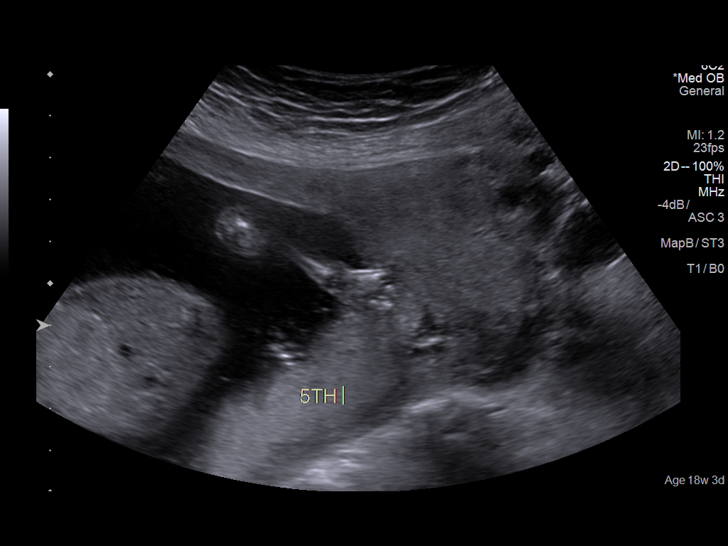
[im 53/84]
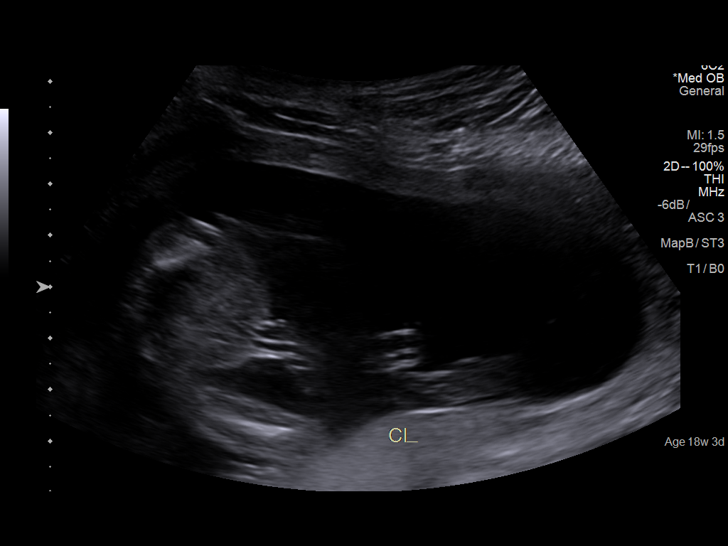
[im 59/84]
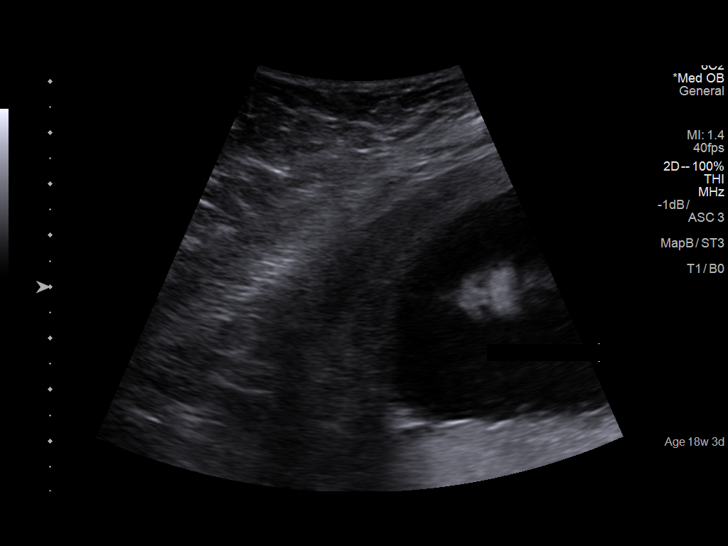
[im 65/84]
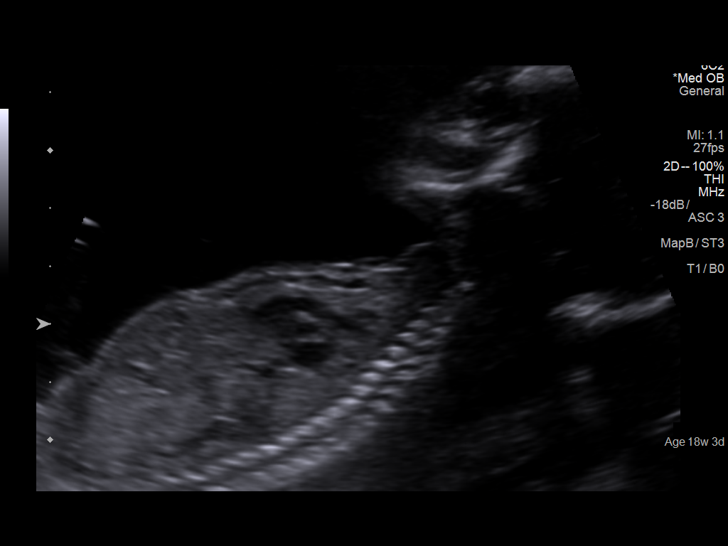
[im 71/84]
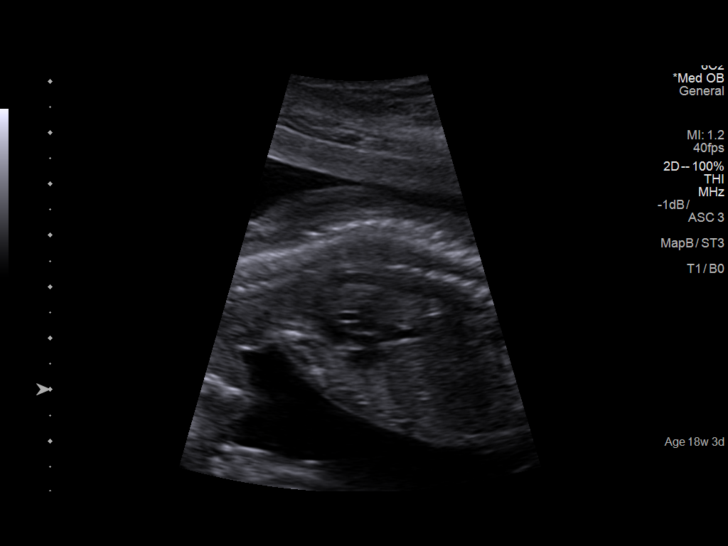
[im 77/84]
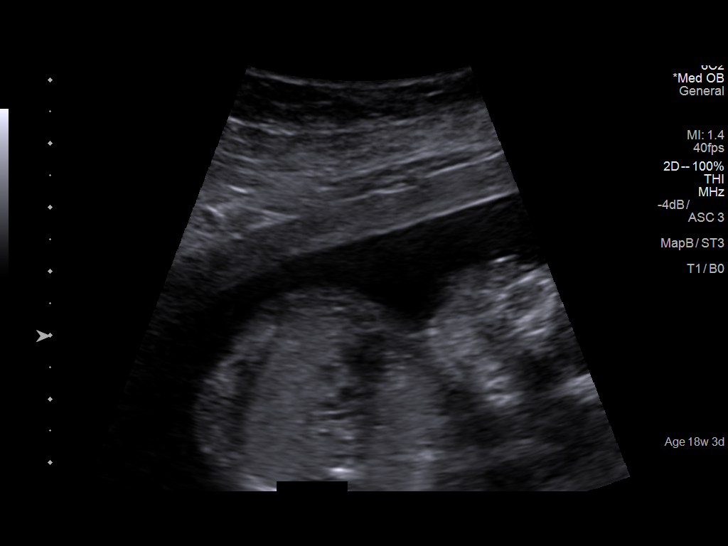
[im 84/84]
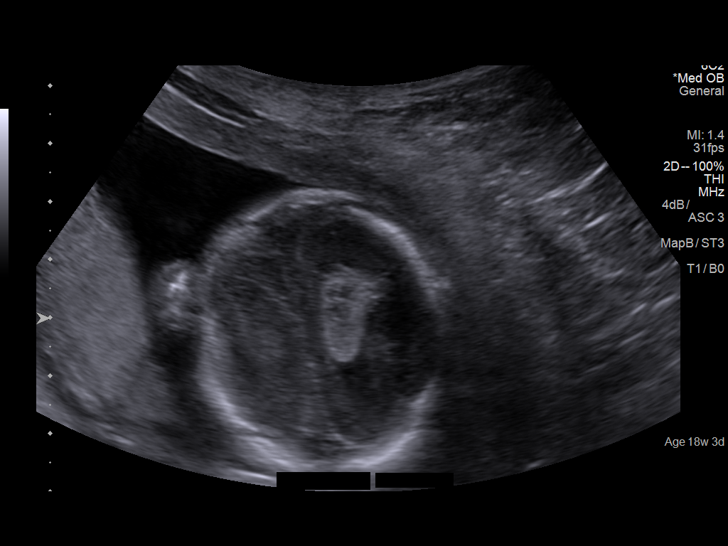

[14 of 28 positions shown; findings below may reference images not displayed]

Canned report from images found in remote index.

Refer to host system for actual result text.

## 2019-08-08 ENCOUNTER — Ambulatory Visit: Payer: Self-pay

## 2019-08-10 ENCOUNTER — Ambulatory Visit: Payer: Self-pay | Attending: Internal Medicine

## 2019-08-10 DIAGNOSIS — Z23 Encounter for immunization: Secondary | ICD-10-CM

## 2019-08-10 NOTE — Progress Notes (Signed)
   Covid-19 Vaccination Clinic  Name:  Christine Clark    MRN: [id number] DOB: 1990-11-28  08/10/2019  Ms. Christine Clark was observed post Covid-19 immunization for 15 minutes without incident. She was provided with Vaccine Information Sheet and instruction to access the V-Safe system.   Ms. Christine Clark was instructed to call 911 with any severe reactions post vaccine: Marland Kitchen Difficulty breathing  . Swelling of face and throat  . A fast heartbeat  . A bad rash all over body  . Dizziness and weakness   Immunizations Administered    Name Date Dose VIS Date Route   Pfizer COVID-19 Vaccine 08/10/2019  2:50 PM 0.3 mL 04/25/2019 Intramuscular   Manufacturer: [employer], [employer]   Lot: [id number]   NDC: [address]-9

## 2019-08-31 ENCOUNTER — Ambulatory Visit: Payer: Self-pay | Attending: Internal Medicine

## 2019-08-31 DIAGNOSIS — Z23 Encounter for immunization: Secondary | ICD-10-CM

## 2019-08-31 NOTE — Progress Notes (Signed)
   Covid-19 Vaccination Clinic  Name:  Christine Clark    MRN: [id number] DOB: 02/11/1991  08/31/2019  Christine Clark was observed post Covid-19 immunization for 15 minutes without incident. She was provided with Vaccine Information Sheet and instruction to access the V-Safe system.   Christine Clark was instructed to call 911 with any severe reactions post vaccine: Marland Kitchen Difficulty breathing  . Swelling of face and throat  . A fast heartbeat  . A bad rash all over body  . Dizziness and weakness   Immunizations Administered    Name Date Dose VIS Date Route   Pfizer COVID-19 Vaccine 08/31/2019  1:45 PM 0.3 mL 04/25/2019 Intramuscular   Manufacturer: [employer], [employer]   Lot: [id number]   NDC: [address]-1
